# Patient Record
Sex: Female | Born: 1975
Health system: Southern US, Community
[De-identification: ages and names within clinical notes are randomized; demographics above are authoritative.]

## PROBLEM LIST (undated history)

## (undated) DIAGNOSIS — D649 Anemia, unspecified: Secondary | ICD-10-CM

## (undated) DIAGNOSIS — E785 Hyperlipidemia, unspecified: Secondary | ICD-10-CM

## (undated) DIAGNOSIS — T4145XA Adverse effect of unspecified anesthetic, initial encounter: Secondary | ICD-10-CM

## (undated) DIAGNOSIS — K219 Gastro-esophageal reflux disease without esophagitis: Secondary | ICD-10-CM

## (undated) DIAGNOSIS — T8859XA Other complications of anesthesia, initial encounter: Secondary | ICD-10-CM

## (undated) HISTORY — PX: WISDOM TOOTH EXTRACTION: SHX21

## (undated) HISTORY — PX: UPPER GI ENDOSCOPY: SHX6162

---

## 2007-12-28 ENCOUNTER — Ambulatory Visit: Payer: Self-pay | Admitting: Internal Medicine

## 2007-12-28 DIAGNOSIS — K209 Esophagitis, unspecified without bleeding: Secondary | ICD-10-CM | POA: Insufficient documentation

## 2007-12-28 DIAGNOSIS — E7800 Pure hypercholesterolemia, unspecified: Secondary | ICD-10-CM | POA: Insufficient documentation

## 2008-01-21 ENCOUNTER — Telehealth (INDEPENDENT_AMBULATORY_CARE_PROVIDER_SITE_OTHER): Payer: Self-pay | Admitting: *Deleted

## 2008-02-01 ENCOUNTER — Encounter: Payer: Self-pay | Admitting: Internal Medicine

## 2009-10-22 ENCOUNTER — Encounter: Payer: Self-pay | Admitting: Internal Medicine

## 2009-11-23 ENCOUNTER — Ambulatory Visit: Payer: Self-pay | Admitting: Internal Medicine

## 2009-11-23 DIAGNOSIS — R002 Palpitations: Secondary | ICD-10-CM | POA: Insufficient documentation

## 2009-11-23 DIAGNOSIS — I359 Nonrheumatic aortic valve disorder, unspecified: Secondary | ICD-10-CM | POA: Insufficient documentation

## 2009-11-27 ENCOUNTER — Encounter (INDEPENDENT_AMBULATORY_CARE_PROVIDER_SITE_OTHER): Payer: Self-pay | Admitting: *Deleted

## 2009-12-03 ENCOUNTER — Ambulatory Visit: Payer: Self-pay

## 2009-12-03 ENCOUNTER — Ambulatory Visit: Payer: Self-pay | Admitting: Internal Medicine

## 2009-12-03 ENCOUNTER — Ambulatory Visit (HOSPITAL_COMMUNITY): Admission: RE | Admit: 2009-12-03 | Discharge: 2009-12-03 | Payer: Self-pay | Admitting: Internal Medicine

## 2009-12-04 LAB — CONVERTED CEMR LAB
Creatinine, Ser: 0.9 mg/dL (ref 0.4–1.2)
GFR calc non Af Amer: 92.78 mL/min (ref >60)
Glucose, Bld: 89 mg/dL (ref 70–99)
Sodium: 137 meq/L (ref 135–145)

## 2010-12-10 NOTE — Letter (Signed)
Summary: Appointment - Cardiac MRI  [facility]     [place], [place]    Phone:   Fax:       November 27, 2009 MRN: [id number]   [facility] [address] [place], [place]  [address]   Dear Ms. Brouse,   We have scheduled the above patient for an appointment for a Cardiac MRI on 12-03-2009    at 3:00 p.m.  Please refer to the below information for the location and instructions for this test:  Location:     [facility]       [address]       [place], [place]  [address] Instructions:    Wilmon Arms at [facility] Outpatient Registration 45 minutes prior to your appointment time.  This will ensure you are in the Radiology Department 30 minutes prior to your appointment.    There are no restrictions for this test you may eat and take medications as usual.  If you need to reschedule this appointment please call at the number listed above.     Sincerely,     Lorne Skeens  [facility] Scheduling Team

## 2010-12-10 NOTE — Letter (Signed)
Summary: [facility] Office Note  [facility] Office Note   Imported By: Roderic Ovens 11/30/2009 14:16:21  _____________________________________________________________________  External Attachment:    Type:   Image     Comment:   External Document

## 2010-12-10 NOTE — Assessment & Plan Note (Signed)
Summary: nep/tachycardia/rapid heart beat   Referring Provider:  Rana Snare  CC:  nep/tachycardia.  Pt sometimes feels pressure in her chest after walking up a flight of stairs.  History of Present Illness: Ann Boyle is seen at the request of Dr. Rana Snare because of exercise associated palpitations.  Her last couple of months she has noted a rapid heart rate precipitated by very little exertion. She can stand up and get on a treadmill and her heartrate will be at 80 within moments and within a minute or 2 of exercise of 110 and then shortly thereafter be 170. This is unassociated with significant dyspnea.  She has noted that there is some correlation with her symptoms with volume intake. She notes that he drinks 64 ounces a day her urine is clear her and a rapid heartbeat problem is less severe.There was no antecedent viral infection. Her diet is probably salt depleted.  She also has a history of chest discomfort. This is notable with menses which for her particularly heavy. She describes this as a pressure that radiates into her back and into her right arm. It is unassociated with exertion. It is sometimes associated with recumbency and awakens her at night.  There is no family history of heart disease.  Results no family history of sudden cardiac death.  There is a family history of arachnodactyly.    Current Medications (verified): 1)  Multivitamins  Tabs (Multiple Vitamin) .... 3 Tabs Once Daily 2)  Fish Oil 1000 Mg Caps (Omega-3 Fatty Acids) .... Take One Capsule Two Times A Day 3)  Ferrous Fumarate 325 (106 Fe) Mg Tabs (Ferrous Fumarate) .... Take One Tablet Once Daily  Allergies (verified): No Known Drug Allergies  Past History:  Past Surgical History: Last updated: 12/28/2007 none  Family History: Last updated: 12/28/2007 both parents are in their mid 54s both with hypercholesterolemia two sisters, posture, hypercholesterolemia, one with diabetes  Social History: Last  updated: 12/28/2007 Married  Past Medical History: GERD dysmenorrhagia Anemia-borderline  Review of Systems       full review of systems was negative apart from a history of present illness and past medical history.   Vital Signs:  Patient profile:   Ann Boyle Height:      65 inches Weight:      118 pounds BMI:     19.71 Pulse rate:   76 / minute Pulse (ortho):   71 / minute Pulse rhythm:   regular BP sitting:   100 / 58  (left arm) BP standing:   99 / 69 Cuff size:   regular  Vitals Entered By: Judithe Modest CMA (November 23, 2009 3:52 PM)  Serial Vital Signs/Assessments:  Time      Position  BP       Pulse  Resp  Temp     By 4:55 PM   Lying RA  89/58    65                    Amanda Trulove CMA 4:55 PM   Sitting   97/63    70                    Amanda Trulove CMA 4:55 PM   Standing  99/69    71                    Amanda Trulove CMA  Comments: 4:55 PM 2 minutes-97/77 HR 75 3 minutes- 95/66 HR 66  By: Judithe Modest CMA    Physical Exam  General:  Alert and oriented young African American Boyle appearing her stated age in no acute distress. HEENT  normal . Neck veins were flat; carotids brisk and full without bruits. No lymphadenopathy. Back without kyphosis.no sternal abnormalities Lungs clear. Heart sounds regular with 2/6 diastolic murmur without gallops. PMI nondisplaced. Abdomen soft with active bowel sounds without midline pulsation or hepatomegaly. Femoral pulses and distal pulses intact. Extremities were without clubbing cyanosis or edema.  her wings and was greater than her height. She is able to touch her pinky to her thumb around her wrist, and able to manifest her thumb upon pronation on the dorsum of her hand.Skin warm and dry. Neurological exam grossly normal; affect is normal    EKG  Procedure date:  11/23/2009  Findings:      sinus rhythm at 76 Intervals 0.19/0.08/0.35 Axis LX R. prime in lead V1 Early repolarization diffusely  present  Impression & Recommendations:  Problem # 1:  PALPITATIONS (ICD-785.1) The patient has palpitations and rapid heart rates associated with exercise as documented by heart rate monitoring. It is not clear to me the mechanism of this. Specifically I don't know whether this is an autonomic phenomenon or related possibly to cardiac/aortic issues suggested by stigmata of Marfan's syndrome. These include arachnodactyly and aortic insufficiency as well as evidence of joint laxity.  The fact that she is volume responsive suggested this may be autonomic. She did not manifest orthostatic intolerance today.  Treadmill testing may be important in elucidating this although I would like to defer this until after we get an assessment of her heart and her aorta. Orders: EKG w/ Interpretation (93000) Cardiac MRI (Cardiac MRI) MRA (MRA) Echocardiogram (Echo)  Problem # 2:  MARFANS SYNDROME POSSIBLE (ICD-759.82) For the reasons mentioned above, excluding aortic disease as well as aortic valve disease will be important. I did not appreciate mitral valve prolapse on examination. We will undertake MRI and MRA looking at the cardiac structure and the aorta as well as echocardiography to look at valve function.  Problem # 3:  AORTIC INSUFFICIENCY (ICD-424.1) we'll get an echo as noted above;  I have advised her to refrain from exercise until we get the MRI scan.  Patient Instructions: 1)  Your physician has requested that you have a cardiac MRI AND MRA.  Cardiac MRI uses a computer to create images of your heart as it's beating, producing both still and moving pictures of your heart and major blood vessels. For further information please visit  [email or web address]  Please follow the instruction sheet given to you today for more information. 2)  Your physician has requested that you have an echocardiogram.  Echocardiography is a painless test that uses sound waves to create images of your heart. It provides  your doctor with information about the size and shape of your heart and how well your heart's chambers and valves are working.  This procedure takes approximately one hour. There are no restrictions for this procedure.

## 2011-02-11 ENCOUNTER — Other Ambulatory Visit: Payer: Self-pay | Admitting: Internal Medicine

## 2011-02-11 DIAGNOSIS — R609 Edema, unspecified: Secondary | ICD-10-CM

## 2011-02-13 ENCOUNTER — Ambulatory Visit
Admission: RE | Admit: 2011-02-13 | Discharge: 2011-02-13 | Disposition: A | Discharge status: Home / Self Care | Payer: BC Managed Care – PPO | Source: Ambulatory Visit | Attending: Internal Medicine | Admitting: Internal Medicine

## 2011-02-13 DIAGNOSIS — R609 Edema, unspecified: Secondary | ICD-10-CM

## 2011-09-01 DIAGNOSIS — L6681 Central centrifugal cicatricial alopecia: Secondary | ICD-10-CM | POA: Insufficient documentation

## 2011-09-01 DIAGNOSIS — L668 Other cicatricial alopecia: Secondary | ICD-10-CM | POA: Insufficient documentation

## 2011-11-12 IMAGING — US US EXTREM LOW VENOUS*L*
1 series · 14 of 24 positions shown · non-contrast
Comparison: None.

CLINICAL DATA: Left leg edema

LEFT LOWER EXTREMITY VENOUS DUPLEX ULTRASOUND
TECHNIQUE: Gray-scale sonography with graded compression, as well
as color Doppler and duplex ultrasound, were performed to evaluate
the deep venous system of the lower extremity from the level of the
common femoral vein through the popliteal and proximal calf veins.
Spectral Doppler was utilized to evaluate flow at rest and with
distal augmentation maneuvers.

[Series 1: us extrem low venous*left* · 14 of 31 slices shown]
[im 1/31]
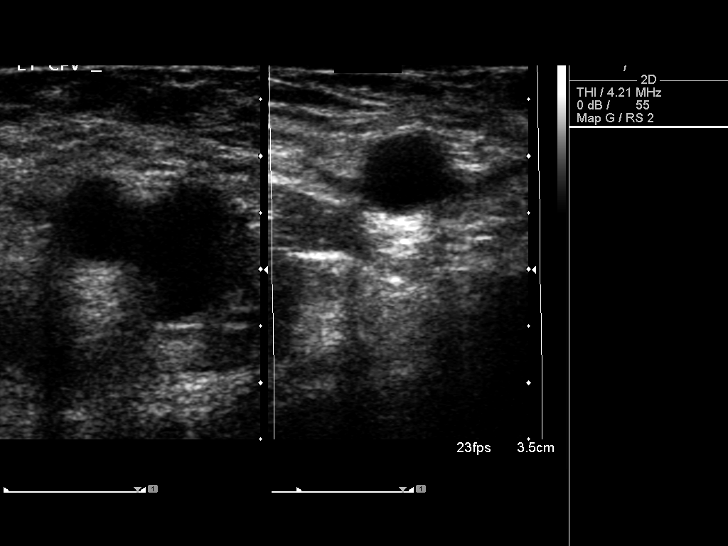
[im 3/31]
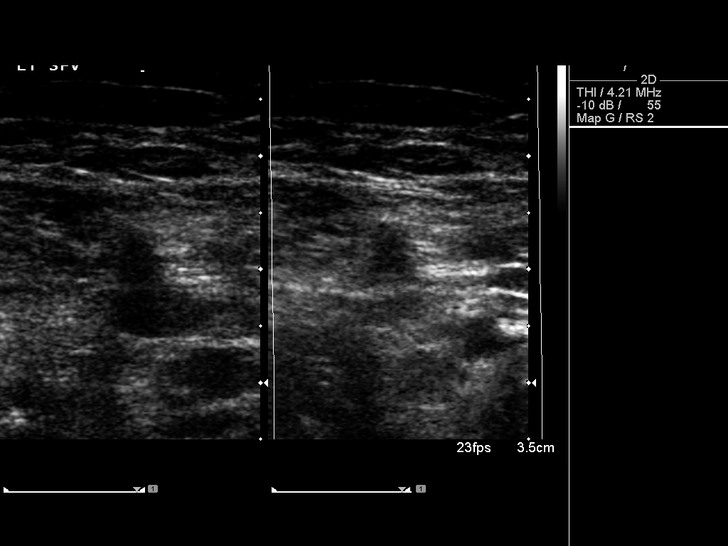
[im 6/31]
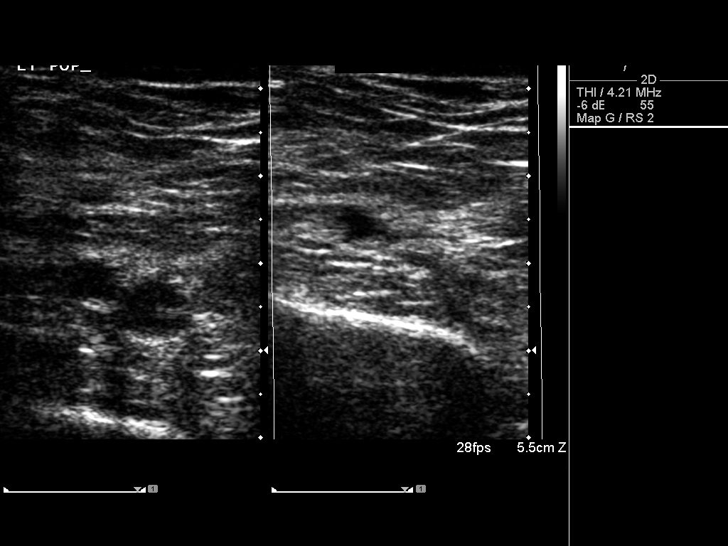
[im 8/31]
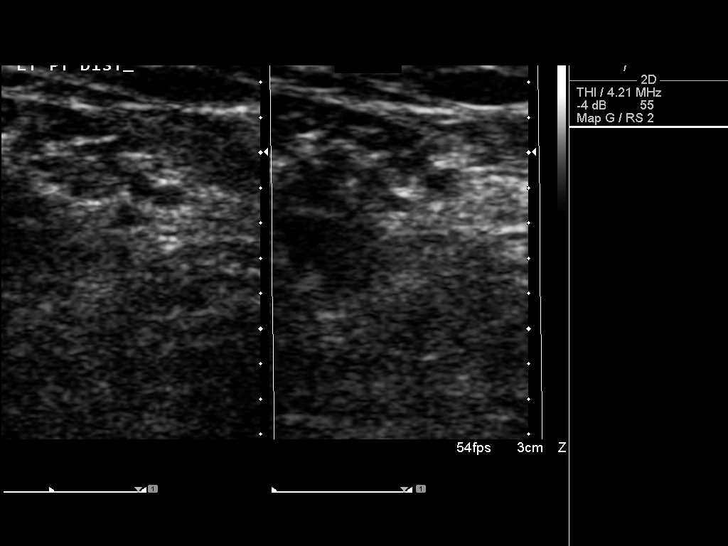
[im 10/31]
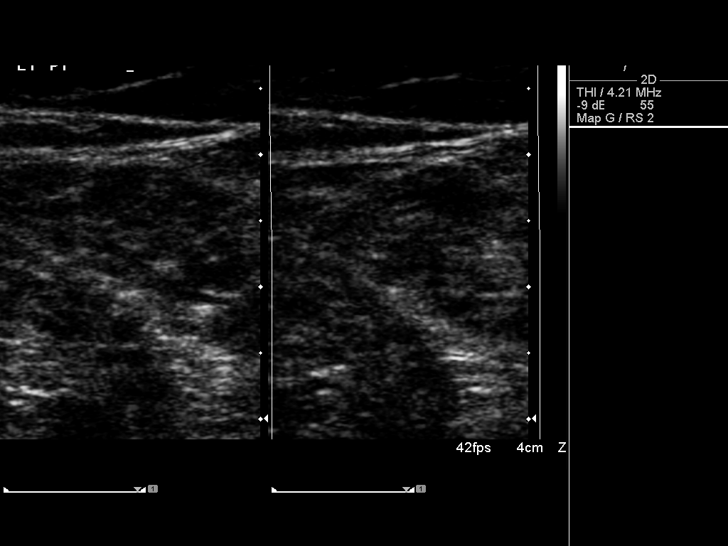
[im 12/31]
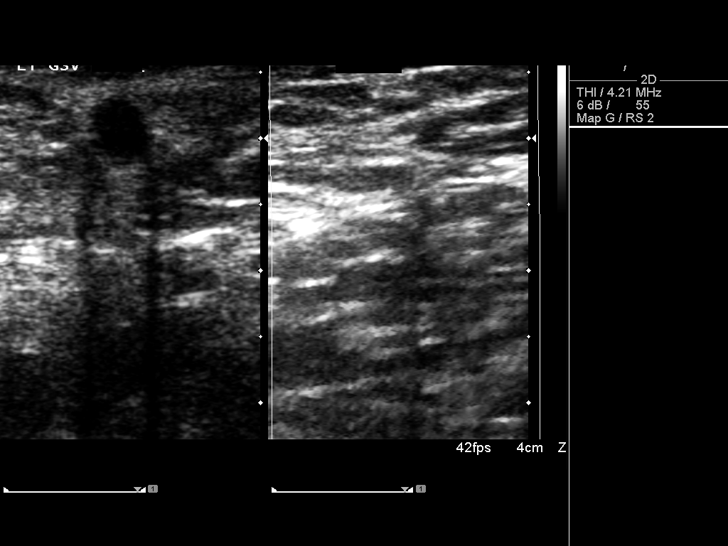
[im 15/31]
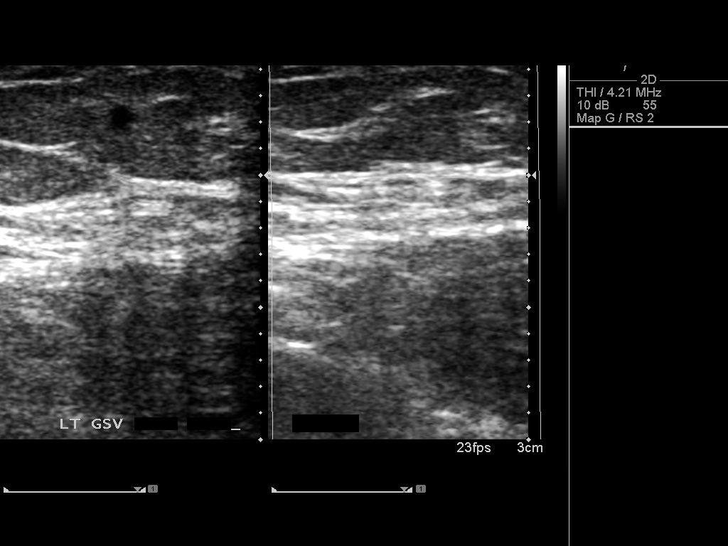
[im 16/31]
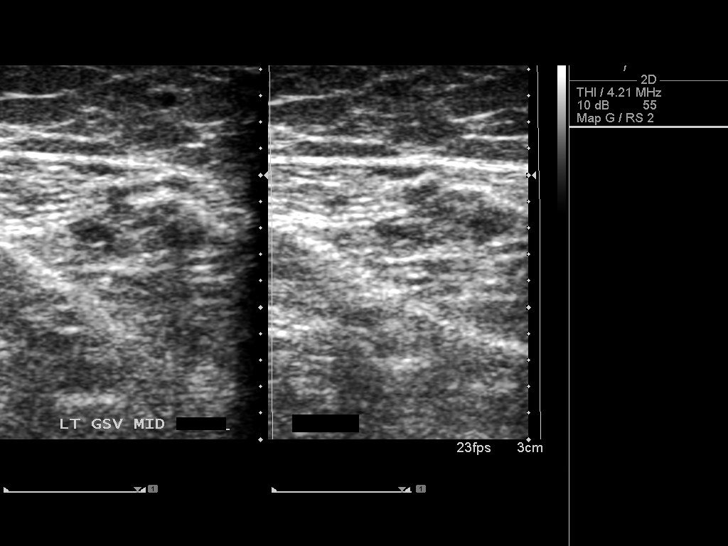
[im 19/31]
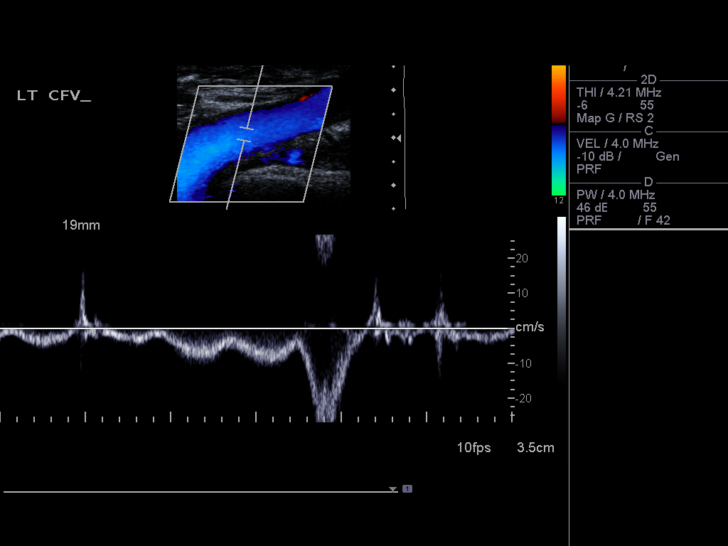
[im 21/31]
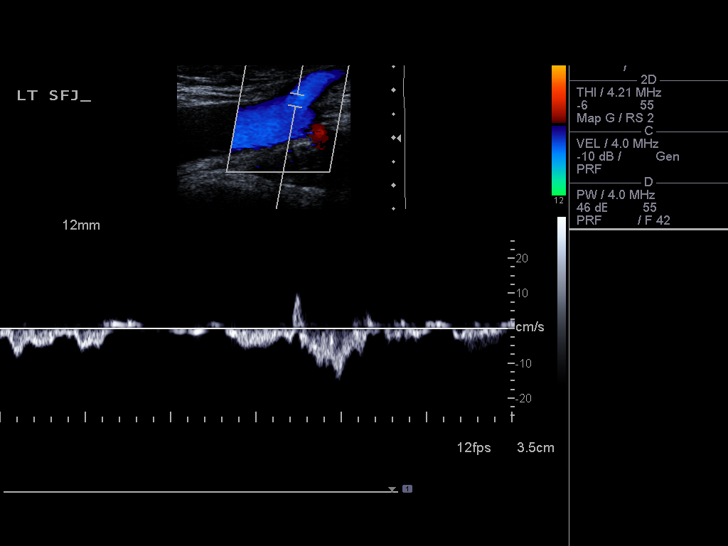
[im 24/31]
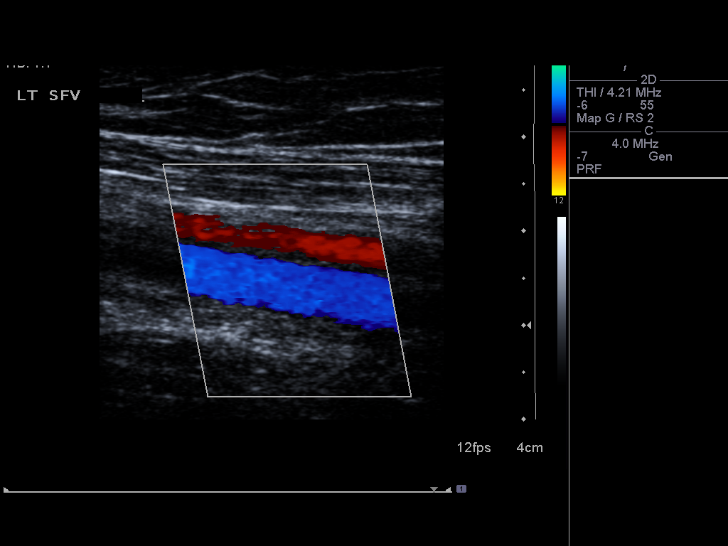
[im 25/31]
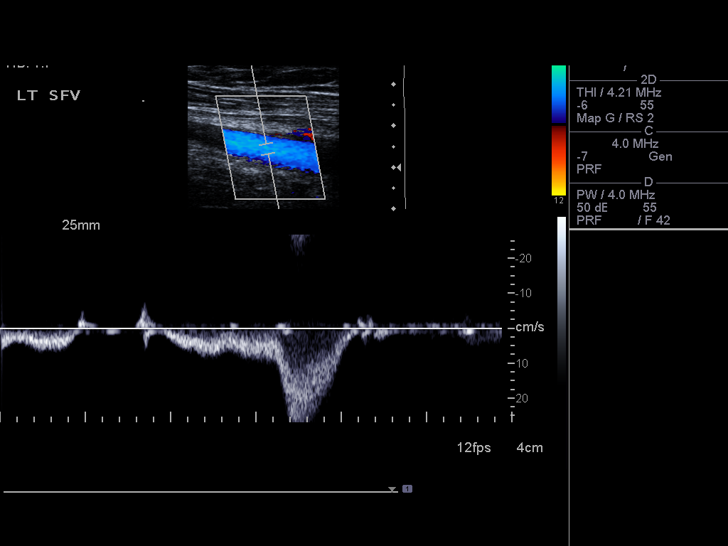
[im 28/31]
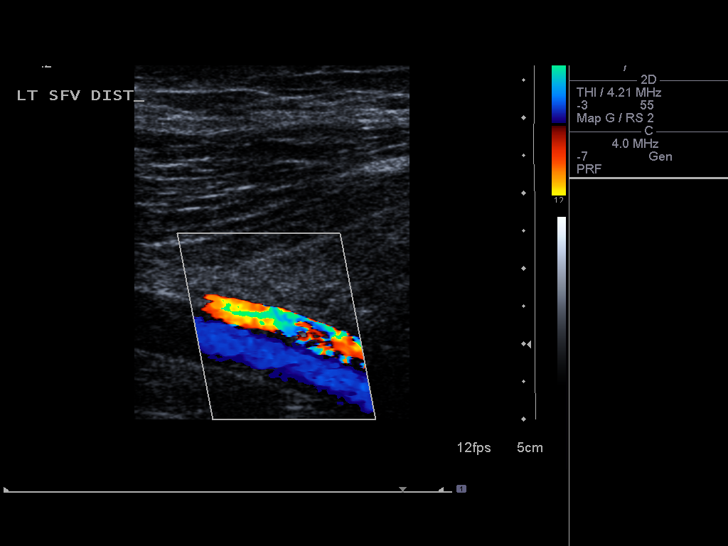
[im 31/31]
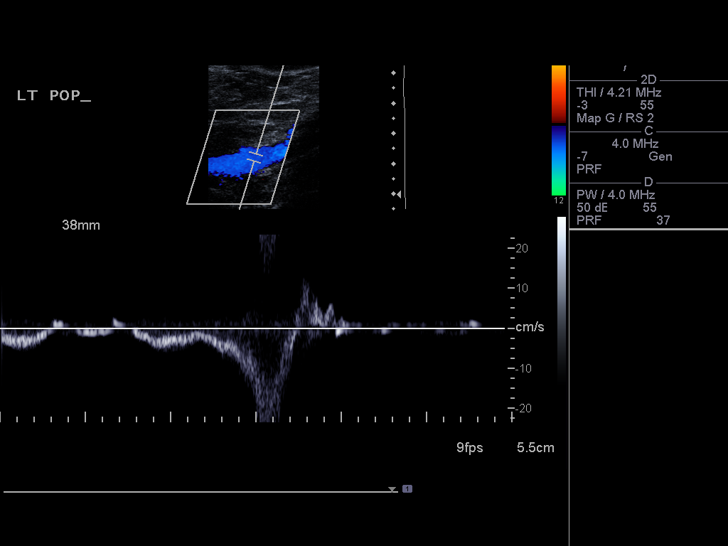

[14 of 24 positions shown; findings below may reference images not displayed]

FINDINGS: The left saphenous - femoral junction, left common
femoral vein, left profunda femoral vein, left superficial femoral
vein, and left popliteal vein all compress and augment normally.
The greater saphenous vein also compresses normally.  There is no
evidence of deep venous thrombosis.
IMPRESSION: Negative ultrasound of the left leg for deep venous thrombosis.

## 2012-02-09 ENCOUNTER — Emergency Department (HOSPITAL_COMMUNITY)
Admission: EM | Admit: 2012-02-09 | Discharge: 2012-02-10 | Disposition: A | Discharge status: Home / Self Care | Payer: BC Managed Care – PPO | Attending: Emergency Medicine | Admitting: Emergency Medicine

## 2012-02-09 ENCOUNTER — Encounter (HOSPITAL_COMMUNITY): Payer: Self-pay | Admitting: Emergency Medicine

## 2012-02-09 DIAGNOSIS — M5410 Radiculopathy, site unspecified: Secondary | ICD-10-CM

## 2012-02-09 DIAGNOSIS — M5412 Radiculopathy, cervical region: Secondary | ICD-10-CM | POA: Insufficient documentation

## 2012-02-09 NOTE — ED Notes (Signed)
Pt states she was working on the computer this evening and started having pain in her right shoulder area that radiated up into her neck  Pt states her right hand went cold  Pt denies injury

## 2012-02-09 NOTE — ED Provider Notes (Signed)
History     CSN: [id number]  Arrival date & time 02/09/12  2236   First MD Initiated Contact with Patient 02/09/12 2333      Chief Complaint  Patient presents with  . Neck Pain  . Shoulder Pain    (Consider location/radiation/quality/duration/timing/severity/associated sxs/prior treatment) HPI Comments: Patient was sitting in bed with her laptop in her lap when she noticed that she had right shoulder pain that radiated to her right arm with a feeling of coolness.  This has since resolved, since she's in the emergency room.  She has not taken any over-the-counter medication for this.  She denies trauma to her neck in the past  Patient is a 36 y.o. female presenting with neck pain and shoulder pain. The history is provided by the patient.  Neck Pain  This is a new problem. The current episode started 1 to 2 hours ago. The problem has been gradually improving. The pain is associated with an unknown factor. There has been no fever. The pain is present in the generalized neck. The pain radiates to the right shoulder and right arm. The pain is at a severity of 6/10. The pain is mild. The symptoms are aggravated by position. Associated symptoms include numbness. Pertinent negatives include no headaches and no weakness.  Shoulder Pain Associated symptoms include neck pain and numbness. Pertinent negatives include no fever, headaches or weakness.    History reviewed. No pertinent past medical history.  History reviewed. No pertinent past surgical history.  History reviewed. No pertinent family history.  History  Substance Use Topics  . Smoking status: Never Smoker   . Smokeless tobacco: Not on file  . Alcohol Use: No    OB History    Grav Para Term Preterm Abortions TAB SAB Ect Mult Living                  Review of Systems  Constitutional: Negative for fever.  HENT: Positive for neck pain. Negative for neck stiffness.   Neurological: Positive for numbness. Negative for  dizziness, weakness and headaches.    Allergies  Review of patient's allergies indicates no known allergies.  Home Medications  No current outpatient prescriptions on file.  BP 108/69  Pulse 66  Temp(Src) 98.1 F (36.7 C) (Oral)  Resp 18  SpO2 100%  LMP 02/01/2012  Physical Exam  Constitutional: She appears well-developed and well-nourished.  HENT:  Head: Normocephalic.  Neck: Normal range of motion and full passive range of motion without pain. Neck supple. Muscular tenderness present. No spinous process tenderness present. No rigidity. No edema, no erythema and normal range of motion present.    Cardiovascular: Normal rate.   Pulmonary/Chest: Effort normal.  Musculoskeletal: Normal range of motion. She exhibits no edema and no tenderness.       Full range of motion of the right shoulder, elbow, fingers, positive pulses, cap refill less than 3 second color, sensation, and extremity normal, as compared to the left  Neurological: She is alert.  Skin: Skin is warm.    ED Course  Procedures (including critical care time)  Labs Reviewed - No data to display No results found.   No diagnosis found.    MDM  Transient Radiculopathy        Arman Filter, NP 02/10/12 0007

## 2012-02-10 NOTE — Discharge Instructions (Signed)
Cervical Radiculopathy Cervical radiculopathy happens when a nerve in the neck is pinched or bruised by a slipped (herniated) disk or by arthritic changes in the bones of the cervical spine. This can occur due to an injury or as part of the normal aging process. Pressure on the cervical nerves can cause pain or numbness that runs from your neck all the way down into your arm and fingers. CAUSES  There are many possible causes, including:  Injury.   Muscle tightness in the neck from overuse.   Swollen, painful joints (arthritis).   Breakdown or degeneration in the bones and joints of the spine (spondylosis) due to aging.   Bone spurs that may develop near the cervical nerves.  SYMPTOMS  Symptoms include pain, weakness, or numbness in the affected arm and hand. Pain can be severe or irritating. Symptoms may be worse when extending or turning the neck. DIAGNOSIS  Your caregiver will ask about your symptoms and do a physical exam. He or she may test your strength and reflexes. X-rays, CT scans, and MRI scans may be needed in cases of injury or if the symptoms do not go away after a period of time. Electromyography (EMG) or nerve conduction testing may be done to study how your nerves and muscles are working. TREATMENT  Your caregiver may recommend certain exercises to help relieve your symptoms. Cervical radiculopathy can, and often does, get better with time and treatment. If your problems continue, treatment options may include:  Wearing a soft collar for short periods of time.   Physical therapy to strengthen the neck muscles.   Medicines, such as nonsteroidal anti-inflammatory drugs (NSAIDs), oral corticosteroids, or spinal injections.   Surgery. Different types of surgery may be done depending on the cause of your problems.  HOME CARE INSTRUCTIONS   Put ice on the affected area.   Put ice in a plastic bag.   Place a towel between your skin and the bag.   Leave the ice on for 15  to 20 minutes, 3 to 4 times a day or as directed by your caregiver.   Use a flat pillow when you sleep.   Only take over-the-counter or prescription medicines for pain, discomfort, or fever as directed by your caregiver.   If physical therapy was prescribed, follow your caregiver's directions.   If a soft collar was prescribed, use it as directed.  SEEK IMMEDIATE MEDICAL CARE IF:   Your pain gets much worse and cannot be controlled with medicines.   You have weakness or numbness in your hand, arm, face, or leg.   You have a high fever or a stiff, rigid neck.   You lose bowel or bladder control (incontinence).   You have trouble with walking, balance, or speaking.  MAKE SURE YOU:   Understand these instructions.   Will watch your condition.   Will get help right away if you are not doing well or get worse.  Document Released: 07/22/2001 Document Revised: 10/16/2011 Document Reviewed: 06/10/2011 [facility] Patient Information 2012 [place], [place]. I feel that this is due to the position you were sitting in while working on your laptop, if the symptoms recur or become persistent please make an appointment with your primary care physician for further evaluation of your cervical spine

## 2012-02-11 NOTE — ED Provider Notes (Signed)
Medical screening examination/treatment/procedure(s) were performed by non-physician practitioner and as supervising physician I was immediately available for consultation/collaboration.  Kauan Kloosterman, MD 02/11/12 2135 

## 2015-03-18 ENCOUNTER — Emergency Department (INDEPENDENT_AMBULATORY_CARE_PROVIDER_SITE_OTHER)
Admission: EM | Admit: 2015-03-18 | Discharge: 2015-03-18 | Disposition: A | Discharge status: Home / Self Care | Payer: BLUE CROSS/BLUE SHIELD | Source: Home / Self Care | Attending: Family Medicine | Admitting: Family Medicine

## 2015-03-18 ENCOUNTER — Encounter: Payer: Self-pay | Admitting: *Deleted

## 2015-03-18 DIAGNOSIS — H04201 Unspecified epiphora, right lacrimal gland: Secondary | ICD-10-CM

## 2015-03-18 HISTORY — DX: Hyperlipidemia, unspecified: E78.5

## 2015-03-18 MED ORDER — SULFACETAMIDE SODIUM 10 % OP SOLN: 1.0000 [drp] | OPHTHALMIC | Status: DC

## 2015-03-18 NOTE — Discharge Instructions (Signed)
Continue warm compress several times daily.  Massage right tear duct several times daily.

## 2015-03-18 NOTE — ED Provider Notes (Signed)
CSN: [id number]     Arrival date & time 03/18/15  1100 History   First MD Initiated Contact with Patient 03/18/15 1142     Chief Complaint  Patient presents with  . Eye Drainage      HPI Comments: Patient complains of two day history of increased lacrimation from her right eye.  She recalls no injury and she has no foreign body sensation.  She denies redness of her eye or change in vision, but has had mild itching.  No increase in nasal congestion.  She has had some yellowish discharge from her right eye in the morning, becoming clear during the day.  She started using some left over sulfacetamide ophthalmic suspension last night.  Patient is a 39 y.o. female presenting with eye problem. The history is provided by the patient.  Eye Problem Location:  R eye Quality: itching. Severity:  Mild Onset quality:  Sudden Duration:  2 days Timing:  Intermittent Progression:  Unchanged Chronicity:  New Context: not burn, not chemical exposure, not contact lens problem, not direct trauma, not foreign body and not scratch   Relieved by:  Nothing Worsened by:  Nothing tried Ineffective treatments:  Eye drops Associated symptoms: discharge, itching and tearing   Associated symptoms: no blurred vision, no crusting, no decreased vision, no double vision, no foreign body sensation, no headaches, no inflammation, no photophobia, no redness, no scotomas and no swelling   Risk factors: no recent URI     Past Medical History  Diagnosis Date  . Hyperlipidemia    History reviewed. No pertinent past surgical history. History reviewed. No pertinent family history. History  Substance Use Topics  . Smoking status: Never Smoker   . Smokeless tobacco: Never Used  . Alcohol Use: No   OB History    No data available     Review of Systems  Eyes: Positive for discharge and itching. Negative for blurred vision, double vision, photophobia and redness.  Neurological: Negative for headaches.  All other  systems reviewed and are negative.   Allergies  Review of patient's allergies indicates no known allergies.  Home Medications   Prior to Admission medications   Medication Sig Start Date End Date Taking? Authorizing Provider  sulfacetamide (BLEPH-10) 10 % ophthalmic solution Place 1-2 drops into the right eye every 4 (four) hours. 03/18/15   Kandra Nicolas, MD   BP 98/61 mmHg  Pulse 79  Temp(Src) 97.9 F (36.6 C) (Oral)  Resp 14  Wt 115 lb (52.164 kg)  SpO2 100%  LMP 02/27/2015 Physical Exam  Constitutional: She appears well-developed and well-nourished. No distress.  HENT:  Head: Normocephalic.  Nose: Nose normal.  Mouth/Throat: Oropharynx is clear and moist.  Eyes: Conjunctivae, EOM and lids are normal. Pupils are equal, round, and reactive to light. Lids are everted and swept, no foreign bodies found. Right eye exhibits no chemosis, no discharge, no exudate and no hordeolum. No foreign body present in the right eye. Left eye exhibits no chemosis and no discharge.  Fluorescein to the right eye reveals no uptake.  Neck: Neck supple.  Lymphadenopathy:    She has no cervical adenopathy.  Nursing note and vitals reviewed.   ED Course  Procedures  none  MDM  Excessive lacrimation, right ?etiology.  Note normal exam   Continue sulfacetamide ophthalmic suspension.  Continue warm compress several times daily.  Massage right tear duct several times daily. Followup with ophthalmologist if not improved one week.     Kandra Nicolas,  MD 03/27/15 1213

## 2015-03-18 NOTE — ED Notes (Signed)
Pt c/o 2-3 days of right eye drainage and itching.

## 2016-07-02 DIAGNOSIS — B373 Candidiasis of vulva and vagina: Secondary | ICD-10-CM | POA: Diagnosis not present

## 2016-07-02 DIAGNOSIS — Z01419 Encounter for gynecological examination (general) (routine) without abnormal findings: Secondary | ICD-10-CM | POA: Diagnosis not present

## 2016-07-02 DIAGNOSIS — Z1151 Encounter for screening for human papillomavirus (HPV): Secondary | ICD-10-CM | POA: Diagnosis not present

## 2016-07-02 DIAGNOSIS — Z681 Body mass index (BMI) 19 or less, adult: Secondary | ICD-10-CM | POA: Diagnosis not present

## 2016-07-08 ENCOUNTER — Other Ambulatory Visit (HOSPITAL_COMMUNITY): Payer: Self-pay | Admitting: Obstetrics and Gynecology

## 2016-07-08 DIAGNOSIS — N920 Excessive and frequent menstruation with regular cycle: Secondary | ICD-10-CM

## 2016-07-10 ENCOUNTER — Ambulatory Visit (HOSPITAL_COMMUNITY): Payer: BLUE CROSS/BLUE SHIELD

## 2016-07-15 DIAGNOSIS — S0591XA Unspecified injury of right eye and orbit, initial encounter: Secondary | ICD-10-CM | POA: Diagnosis not present

## 2016-09-16 DIAGNOSIS — Z23 Encounter for immunization: Secondary | ICD-10-CM | POA: Diagnosis not present

## 2017-03-27 ENCOUNTER — Other Ambulatory Visit: Payer: Self-pay | Admitting: Internal Medicine

## 2017-03-27 DIAGNOSIS — Z0000 Encounter for general adult medical examination without abnormal findings: Secondary | ICD-10-CM | POA: Diagnosis not present

## 2017-03-27 DIAGNOSIS — Z1231 Encounter for screening mammogram for malignant neoplasm of breast: Secondary | ICD-10-CM

## 2017-03-27 DIAGNOSIS — D649 Anemia, unspecified: Secondary | ICD-10-CM | POA: Diagnosis not present

## 2017-03-27 DIAGNOSIS — Z23 Encounter for immunization: Secondary | ICD-10-CM | POA: Diagnosis not present

## 2017-04-03 ENCOUNTER — Ambulatory Visit
Admission: RE | Admit: 2017-04-03 | Discharge: 2017-04-03 | Disposition: A | Discharge status: Home / Self Care | Payer: BLUE CROSS/BLUE SHIELD | Source: Ambulatory Visit | Attending: Internal Medicine | Admitting: Internal Medicine

## 2017-04-03 DIAGNOSIS — Z1231 Encounter for screening mammogram for malignant neoplasm of breast: Secondary | ICD-10-CM | POA: Diagnosis not present

## 2017-05-19 DIAGNOSIS — N920 Excessive and frequent menstruation with regular cycle: Secondary | ICD-10-CM | POA: Diagnosis not present

## 2017-05-22 DIAGNOSIS — R05 Cough: Secondary | ICD-10-CM | POA: Diagnosis not present

## 2017-06-29 ENCOUNTER — Encounter (HOSPITAL_COMMUNITY): Payer: Self-pay | Admitting: *Deleted

## 2017-07-01 ENCOUNTER — Other Ambulatory Visit: Payer: Self-pay | Admitting: Obstetrics and Gynecology

## 2017-07-16 ENCOUNTER — Ambulatory Visit (HOSPITAL_COMMUNITY)
Admission: RE | Admit: 2017-07-16 | Payer: BLUE CROSS/BLUE SHIELD | Source: Ambulatory Visit | Admitting: Obstetrics and Gynecology

## 2017-07-16 HISTORY — DX: Gastro-esophageal reflux disease without esophagitis: K21.9

## 2017-07-16 SURGERY — DILATATION & CURETTAGE/HYSTEROSCOPY WITH MYOSURE
Anesthesia: Choice

## 2017-08-05 DIAGNOSIS — Z3040 Encounter for surveillance of contraceptives, unspecified: Secondary | ICD-10-CM | POA: Diagnosis not present

## 2017-08-05 DIAGNOSIS — Z01419 Encounter for gynecological examination (general) (routine) without abnormal findings: Secondary | ICD-10-CM | POA: Diagnosis not present

## 2017-08-05 DIAGNOSIS — N920 Excessive and frequent menstruation with regular cycle: Secondary | ICD-10-CM | POA: Diagnosis not present

## 2017-08-05 DIAGNOSIS — K621 Rectal polyp: Secondary | ICD-10-CM | POA: Diagnosis not present

## 2017-08-05 DIAGNOSIS — K30 Functional dyspepsia: Secondary | ICD-10-CM | POA: Diagnosis not present

## 2017-08-05 DIAGNOSIS — Z1231 Encounter for screening mammogram for malignant neoplasm of breast: Secondary | ICD-10-CM | POA: Diagnosis not present

## 2017-08-05 DIAGNOSIS — Z6820 Body mass index (BMI) 20.0-20.9, adult: Secondary | ICD-10-CM | POA: Diagnosis not present

## 2017-08-18 DIAGNOSIS — R1013 Epigastric pain: Secondary | ICD-10-CM | POA: Diagnosis not present

## 2017-08-18 DIAGNOSIS — K219 Gastro-esophageal reflux disease without esophagitis: Secondary | ICD-10-CM | POA: Diagnosis not present

## 2017-08-18 DIAGNOSIS — D509 Iron deficiency anemia, unspecified: Secondary | ICD-10-CM | POA: Diagnosis not present

## 2017-11-18 DIAGNOSIS — Z23 Encounter for immunization: Secondary | ICD-10-CM | POA: Diagnosis not present

## 2018-05-03 DIAGNOSIS — D509 Iron deficiency anemia, unspecified: Secondary | ICD-10-CM | POA: Diagnosis not present

## 2018-05-03 DIAGNOSIS — K259 Gastric ulcer, unspecified as acute or chronic, without hemorrhage or perforation: Secondary | ICD-10-CM | POA: Diagnosis not present

## 2018-05-03 DIAGNOSIS — K319 Disease of stomach and duodenum, unspecified: Secondary | ICD-10-CM | POA: Diagnosis not present

## 2018-05-03 DIAGNOSIS — K219 Gastro-esophageal reflux disease without esophagitis: Secondary | ICD-10-CM | POA: Diagnosis not present

## 2018-05-03 DIAGNOSIS — K2970 Gastritis, unspecified, without bleeding: Secondary | ICD-10-CM | POA: Diagnosis not present

## 2018-05-03 DIAGNOSIS — R1013 Epigastric pain: Secondary | ICD-10-CM | POA: Diagnosis not present

## 2018-07-22 DIAGNOSIS — N920 Excessive and frequent menstruation with regular cycle: Secondary | ICD-10-CM | POA: Diagnosis not present

## 2018-08-02 ENCOUNTER — Other Ambulatory Visit: Payer: Self-pay | Admitting: Obstetrics and Gynecology

## 2018-08-03 ENCOUNTER — Encounter (HOSPITAL_COMMUNITY): Payer: Self-pay | Admitting: *Deleted

## 2018-08-03 ENCOUNTER — Other Ambulatory Visit: Payer: Self-pay

## 2018-08-05 DIAGNOSIS — R1013 Epigastric pain: Secondary | ICD-10-CM | POA: Diagnosis not present

## 2018-08-05 DIAGNOSIS — K219 Gastro-esophageal reflux disease without esophagitis: Secondary | ICD-10-CM | POA: Diagnosis not present

## 2018-08-05 DIAGNOSIS — D509 Iron deficiency anemia, unspecified: Secondary | ICD-10-CM | POA: Diagnosis not present

## 2018-08-11 DIAGNOSIS — Z01419 Encounter for gynecological examination (general) (routine) without abnormal findings: Secondary | ICD-10-CM | POA: Diagnosis not present

## 2018-08-11 DIAGNOSIS — Z1231 Encounter for screening mammogram for malignant neoplasm of breast: Secondary | ICD-10-CM | POA: Diagnosis not present

## 2018-08-11 DIAGNOSIS — Z124 Encounter for screening for malignant neoplasm of cervix: Secondary | ICD-10-CM | POA: Diagnosis not present

## 2018-08-11 DIAGNOSIS — Z6820 Body mass index (BMI) 20.0-20.9, adult: Secondary | ICD-10-CM | POA: Diagnosis not present

## 2018-08-11 NOTE — H&P (Signed)
   Reason for admission:   Hysteroscopy with D&C and possible polypectomy/myomectomy History:     42 yo G0 scheduled for HSC/D&C/polypectomy/myomectomy for menorrhagia with anemia Medical records from [facility] with TVUS 05/2017: 2.3 cm polyp vs submucosal fibroid. Patient opted to proceed with [place] and not repeat SIS  Cycle monthly, 6-7 days, 1.5 heavy day requiring 1 super plus tampon per hour, clots ad 1 cm, dysmenorrhea starts 2 days before and lasts 5 more days, low abdomen with low back pain, intensity 4-5/10 Naproxen 500 =0/10. No BTB, no dyspareunia, no PCB. Not planning pregnancy but pregnancy would be welcomed  Review of system:  Non-contributory   Past Medical History:   Past Medical History:  Diagnosis Date  . Anemia   . Complication of anesthesia    reaction to laughing gas  . GERD (gastroesophageal reflux disease)    diet controlled - otc pepcid prn  . Hyperlipidemia    diet controlled    Allergies:  No Known Allergies  Social History:  Married, non-smoker, homemaker  Family History:  Mother with anemia Sister with S trait  Physical exam:    General Appearance: Alert, appropriate appearance for age. No acute distress Neck / Thyroid: Supple, no masses, nodes or enlargement Lungs: clear to auscultation bilaterally Back: No CVA tenderness. Cardiovascular: Regular rate and rhythm. S1, S2, no murmur Gastrointestinal: Soft, non-tender, no masses or organomegaly Pelvic Exam: Vulva and vagina appear normal. Bimanual exam reveals enlarged  Uterus at 6-8 weeks. Normal adnexa x 2   Assessment:   Menorrhagia with possible submucosal fibroid   Plan:    Proceed with Hysteroscopy / polypectomy/ D&C+/- myomectomy. Procedure, risks and benefits reviewed with patient including but not limited to bleeding, infection, injury to uterus and subsequently to intra-abdominal organs and expected recovery. Patient voices understanding and is agreeable to proceed.       Delsa Bern A MD

## 2018-08-24 ENCOUNTER — Other Ambulatory Visit: Payer: Self-pay

## 2018-08-24 ENCOUNTER — Ambulatory Visit (HOSPITAL_COMMUNITY): Payer: BLUE CROSS/BLUE SHIELD | Admitting: Certified Registered Nurse Anesthetist

## 2018-08-24 ENCOUNTER — Encounter (HOSPITAL_COMMUNITY)
Admission: AD | Disposition: A | Discharge status: Home / Self Care | Payer: Self-pay | Source: Ambulatory Visit | Attending: Obstetrics and Gynecology

## 2018-08-24 ENCOUNTER — Ambulatory Visit (HOSPITAL_COMMUNITY)
Admission: AD | Admit: 2018-08-24 | Discharge: 2018-08-24 | Disposition: A | Discharge status: Home / Self Care | Payer: BLUE CROSS/BLUE SHIELD | Source: Ambulatory Visit | Attending: Obstetrics and Gynecology | Admitting: Obstetrics and Gynecology

## 2018-08-24 ENCOUNTER — Encounter (HOSPITAL_COMMUNITY): Payer: Self-pay

## 2018-08-24 DIAGNOSIS — Z79899 Other long term (current) drug therapy: Secondary | ICD-10-CM | POA: Diagnosis not present

## 2018-08-24 DIAGNOSIS — K219 Gastro-esophageal reflux disease without esophagitis: Secondary | ICD-10-CM | POA: Insufficient documentation

## 2018-08-24 DIAGNOSIS — D649 Anemia, unspecified: Secondary | ICD-10-CM | POA: Diagnosis not present

## 2018-08-24 DIAGNOSIS — E785 Hyperlipidemia, unspecified: Secondary | ICD-10-CM | POA: Diagnosis not present

## 2018-08-24 DIAGNOSIS — D259 Leiomyoma of uterus, unspecified: Secondary | ICD-10-CM | POA: Diagnosis not present

## 2018-08-24 DIAGNOSIS — N920 Excessive and frequent menstruation with regular cycle: Secondary | ICD-10-CM | POA: Diagnosis not present

## 2018-08-24 DIAGNOSIS — N840 Polyp of corpus uteri: Secondary | ICD-10-CM | POA: Insufficient documentation

## 2018-08-24 HISTORY — PX: DILATATION & CURRETTAGE/HYSTEROSCOPY WITH RESECTOCOPE: SHX5572

## 2018-08-24 HISTORY — DX: Other complications of anesthesia, initial encounter: T88.59XA

## 2018-08-24 HISTORY — DX: Adverse effect of unspecified anesthetic, initial encounter: T41.45XA

## 2018-08-24 HISTORY — DX: Anemia, unspecified: D64.9

## 2018-08-24 LAB — CBC
HCT: 33.8 % — ABNORMAL LOW (ref 36.0–46.0)
HEMOGLOBIN: 11.1 g/dL — AB (ref 12.0–15.0)
MCH: 29.6 pg (ref 26.0–34.0)
MCHC: 32.8 g/dL (ref 30.0–36.0)
MCV: 90.1 fL (ref 80.0–100.0)
NRBC: 0 % (ref 0.0–0.2)
PLATELETS: 194 10*3/uL (ref 150–400)
RBC: 3.75 MIL/uL — AB (ref 3.87–5.11)
RDW: 15.4 % (ref 11.5–15.5)
WBC: 3.7 10*3/uL — ABNORMAL LOW (ref 4.0–10.5)

## 2018-08-24 LAB — PREGNANCY, URINE: PREG TEST UR: NEGATIVE

## 2018-08-24 SURGERY — DILATATION & CURETTAGE/HYSTEROSCOPY WITH RESECTOCOPE
Anesthesia: General | Site: Vagina

## 2018-08-24 MED ORDER — PROPOFOL 10 MG/ML IV BOLUS
INTRAVENOUS | Status: AC
  Filled 2018-08-24: qty 20

## 2018-08-24 MED ORDER — MIDAZOLAM HCL 2 MG/2ML IJ SOLN
INTRAMUSCULAR | Status: DC | PRN
  Administered 2018-08-24: 2 mg via INTRAVENOUS

## 2018-08-24 MED ORDER — PROPOFOL 10 MG/ML IV BOLUS
INTRAVENOUS | Status: DC | PRN
  Administered 2018-08-24: 200 mg via INTRAVENOUS

## 2018-08-24 MED ORDER — LIDOCAINE HCL (CARDIAC) PF 100 MG/5ML IV SOSY
PREFILLED_SYRINGE | INTRAVENOUS | Status: DC | PRN
  Administered 2018-08-24: 100 mg via INTRAVENOUS

## 2018-08-24 MED ORDER — GLYCOPYRROLATE 0.2 MG/ML IJ SOLN
INTRAMUSCULAR | Status: AC
  Filled 2018-08-24: qty 1

## 2018-08-24 MED ORDER — SCOPOLAMINE 1 MG/3DAYS TD PT72
MEDICATED_PATCH | TRANSDERMAL | Status: AC
  Administered 2018-08-24: 1.5 mg via TRANSDERMAL
  Filled 2018-08-24: qty 1

## 2018-08-24 MED ORDER — GLYCOPYRROLATE 0.2 MG/ML IJ SOLN
INTRAMUSCULAR | Status: DC | PRN
  Administered 2018-08-24: 0.2 mg via INTRAVENOUS

## 2018-08-24 MED ORDER — METOCLOPRAMIDE HCL 5 MG/ML IJ SOLN: 10.0000 mg | Freq: Once | INTRAMUSCULAR | Status: DC | PRN

## 2018-08-24 MED ORDER — MIDAZOLAM HCL 2 MG/2ML IJ SOLN
INTRAMUSCULAR | Status: AC
  Filled 2018-08-24: qty 2

## 2018-08-24 MED ORDER — FENTANYL CITRATE (PF) 100 MCG/2ML IJ SOLN
INTRAMUSCULAR | Status: DC | PRN
  Administered 2018-08-24 (×2): 25 ug via INTRAVENOUS
  Administered 2018-08-24: 50 ug via INTRAVENOUS

## 2018-08-24 MED ORDER — SODIUM CHLORIDE 0.9 % IR SOLN
Status: DC | PRN
  Administered 2018-08-24 (×2): 3000 mL

## 2018-08-24 MED ORDER — CHLOROPROCAINE HCL 1 % IJ SOLN
INTRAMUSCULAR | Status: AC
  Filled 2018-08-24: qty 30

## 2018-08-24 MED ORDER — LACTATED RINGERS IV SOLN
INTRAVENOUS | Status: DC
  Administered 2018-08-24 (×2): via INTRAVENOUS

## 2018-08-24 MED ORDER — LIDOCAINE HCL (CARDIAC) PF 100 MG/5ML IV SOSY
PREFILLED_SYRINGE | INTRAVENOUS | Status: AC
  Filled 2018-08-24: qty 5

## 2018-08-24 MED ORDER — CEFAZOLIN SODIUM-DEXTROSE 2-4 GM/100ML-% IV SOLN
2.0000 g | INTRAVENOUS | Status: AC
  Administered 2018-08-24: 2 g via INTRAVENOUS

## 2018-08-24 MED ORDER — SCOPOLAMINE 1 MG/3DAYS TD PT72
1.0000 | MEDICATED_PATCH | Freq: Once | TRANSDERMAL | Status: DC
  Administered 2018-08-24: 1.5 mg via TRANSDERMAL

## 2018-08-24 MED ORDER — KETOROLAC TROMETHAMINE 30 MG/ML IJ SOLN
INTRAMUSCULAR | Status: DC | PRN
  Administered 2018-08-24: 30 mg via INTRAVENOUS

## 2018-08-24 MED ORDER — MEPERIDINE HCL 25 MG/ML IJ SOLN: 6.2500 mg | INTRAMUSCULAR | Status: DC | PRN

## 2018-08-24 MED ORDER — CEFAZOLIN SODIUM-DEXTROSE 2-4 GM/100ML-% IV SOLN
INTRAVENOUS | Status: AC
  Filled 2018-08-24: qty 100

## 2018-08-24 MED ORDER — DEXAMETHASONE SODIUM PHOSPHATE 10 MG/ML IJ SOLN
INTRAMUSCULAR | Status: DC | PRN
  Administered 2018-08-24: 4 mg via INTRAVENOUS

## 2018-08-24 MED ORDER — CHLOROPROCAINE HCL 1 % IJ SOLN
INTRAMUSCULAR | Status: DC | PRN
  Administered 2018-08-24: 10 mL

## 2018-08-24 MED ORDER — LACTATED RINGERS IV SOLN: INTRAVENOUS | Status: DC

## 2018-08-24 MED ORDER — FENTANYL CITRATE (PF) 100 MCG/2ML IJ SOLN
INTRAMUSCULAR | Status: AC
  Filled 2018-08-24: qty 2

## 2018-08-24 MED ORDER — ONDANSETRON HCL 4 MG/2ML IJ SOLN
INTRAMUSCULAR | Status: AC
  Filled 2018-08-24: qty 2

## 2018-08-24 MED ORDER — ONDANSETRON HCL 4 MG/2ML IJ SOLN
INTRAMUSCULAR | Status: DC | PRN
  Administered 2018-08-24: 4 mg via INTRAVENOUS

## 2018-08-24 MED ORDER — KETOROLAC TROMETHAMINE 30 MG/ML IJ SOLN
INTRAMUSCULAR | Status: AC
  Filled 2018-08-24: qty 1

## 2018-08-24 MED ORDER — FENTANYL CITRATE (PF) 100 MCG/2ML IJ SOLN
25.0000 ug | INTRAMUSCULAR | Status: DC | PRN
  Administered 2018-08-24: 25 ug via INTRAVENOUS

## 2018-08-24 MED ORDER — DEXAMETHASONE SODIUM PHOSPHATE 4 MG/ML IJ SOLN
INTRAMUSCULAR | Status: AC
  Filled 2018-08-24: qty 1

## 2018-08-24 SURGICAL SUPPLY — 16 items
BIPOLAR CUTTING LOOP 21FR (ELECTRODE) ×1
BOOTIES KNEE HIGH SLOAN (MISCELLANEOUS) ×4 IMPLANT
CANISTER SUCT 3000ML PPV (MISCELLANEOUS) ×2 IMPLANT
CATH ROBINSON RED A/P 16FR (CATHETERS) ×2 IMPLANT
DILATOR CANAL MILEX (MISCELLANEOUS) ×2 IMPLANT
ELECT REM PT RETURN 9FT ADLT (ELECTROSURGICAL) ×2
ELECTRODE REM PT RTRN 9FT ADLT (ELECTROSURGICAL) ×1 IMPLANT
GLOVE BIOGEL PI IND STRL 7.0 (GLOVE) ×2 IMPLANT
GLOVE BIOGEL PI INDICATOR 7.0 (GLOVE) ×2
GOWN STRL REUS W/TWL LRG LVL3 (GOWN DISPOSABLE) ×4 IMPLANT
LOOP CUTTING BIPOLAR 21FR (ELECTRODE) ×1 IMPLANT
PACK VAGINAL MINOR WOMEN LF (CUSTOM PROCEDURE TRAY) ×2 IMPLANT
PAD OB MATERNITY 4.3X12.25 (PERSONAL CARE ITEMS) ×2 IMPLANT
TOWEL OR 17X24 6PK STRL BLUE (TOWEL DISPOSABLE) ×2 IMPLANT
TUBING AQUILEX INFLOW (TUBING) ×2 IMPLANT
TUBING AQUILEX OUTFLOW (TUBING) ×2 IMPLANT

## 2018-08-24 NOTE — Anesthesia Preprocedure Evaluation (Signed)
Anesthesia Evaluation  Patient identified by MRN, date of birth, ID band Patient awake    Reviewed: Allergy & Precautions, NPO status , Patient's Chart, lab work & pertinent test results  Airway Mallampati: II  TM Distance: >3 FB Neck ROM: Full    Dental no notable dental hx.    Pulmonary neg pulmonary ROS,    Pulmonary exam normal breath sounds clear to auscultation       Cardiovascular negative cardio ROS Normal cardiovascular exam Rhythm:Regular Rate:Normal     Neuro/Psych negative neurological ROS  negative psych ROS   GI/Hepatic negative GI ROS, Neg liver ROS,   Endo/Other  negative endocrine ROS  Renal/GU negative Renal ROS  negative genitourinary   Musculoskeletal negative musculoskeletal ROS (+)   Abdominal   Peds negative pediatric ROS (+)  Hematology negative hematology ROS (+)   Anesthesia Other Findings   Reproductive/Obstetrics negative OB ROS                             Anesthesia Physical Anesthesia Plan  ASA: I  Anesthesia Plan: General   Post-op Pain Management:    Induction: Intravenous  PONV Risk Score and Plan: 4 or greater and Ondansetron, Dexamethasone, Midazolam and Scopolamine patch - Pre-op  Airway Management Planned: LMA  Additional Equipment:   Intra-op Plan:   Post-operative Plan: Extubation in OR  Informed Consent: I have reviewed the patients History and Physical, chart, labs and discussed the procedure including the risks, benefits and alternatives for the proposed anesthesia with the patient or authorized representative who has indicated his/her understanding and acceptance.   Dental advisory given  Plan Discussed with: CRNA  Anesthesia Plan Comments:         Anesthesia Quick Evaluation

## 2018-08-24 NOTE — Transfer of Care (Signed)
Immediate Anesthesia Transfer of Care Note  Patient: Ann Boyle  Procedure(s) Performed: DILATATION & CURETTAGE/HYSTEROSCOPY WITH RESECTOCOPE (N/A Vagina )  Patient Location: PACU  Anesthesia Type:General  Level of Consciousness: awake, alert  and oriented  Airway & Oxygen Therapy: Patient Spontanous Breathing and Patient connected to nasal cannula oxygen  Post-op Assessment: Report given to RN, Post -op Vital signs reviewed and stable and Patient moving all extremities X 4  Post vital signs: Reviewed and stable  Last Vitals:  Vitals Value Taken Time  BP 117/69 08/24/2018  2:30 PM  Temp    Pulse 99 08/24/2018  2:30 PM  Resp 21 08/24/2018  2:30 PM  SpO2 100 % 08/24/2018  2:30 PM  Vitals shown include unvalidated device data.  Last Pain:  Vitals:   08/24/18 1155  TempSrc: Oral  PainSc: 0-No pain      Patients Stated Pain Goal: 3 (84/72/07 2182)  Complications: No apparent anesthesia complications

## 2018-08-24 NOTE — Interval H&P Note (Signed)
History and Physical Interval Note:  08/24/2018 12:17 PM  Ann Boyle  has presented today for surgery, with the diagnosis of Menorhhagia and Fibroid  The various methods of treatment have been discussed with the patient and family. After consideration of risks, benefits and other options for treatment, the patient has consented to  Procedure(s): [place] (N/A) as a surgical intervention .  The patient's history has been reviewed, patient examined, no change in status, stable for surgery.  I have reviewed the patient's chart and labs.  Questions were answered to the patient's satisfaction.     [name] A Mateya Torti

## 2018-08-24 NOTE — Anesthesia Procedure Notes (Signed)
Procedure Name: LMA Insertion Date/Time: 08/24/2018 12:53 PM Performed by: Hewitt Blade, CRNA Pre-anesthesia Checklist: Patient identified, Emergency Drugs available, Suction available and Patient being monitored Patient Re-evaluated:Patient Re-evaluated prior to induction Oxygen Delivery Method: Circle system utilized Preoxygenation: Pre-oxygenation with 100% oxygen Induction Type: IV induction Ventilation: Mask ventilation without difficulty LMA Size: 3.0 Number of attempts: 1 Placement Confirmation: positive ETCO2 and breath sounds checked- equal and bilateral Tube secured with: Tape Dental Injury: Teeth and Oropharynx as per pre-operative assessment

## 2018-08-24 NOTE — Op Note (Signed)
Preop diagnosis: menorrhagia with endometrial mass  Postop diagnosis: same with endometrial polyp  Anesthesia: IV sedation and paracervical block  Anesthesiologist: Dr. Marcell Barlow  Procedure: Hysteroscopy, resection of endometrial polyp and curettage  Surgeon: Dr. [name] Rosealee Recinos  Procedure: After being informed of the planned procedure with possible complications including bleeding, infection and uterine perforation, informed consent was obtained and patient was taken to OR #4.  She was given IV sedation without complication. She was placed in a dorsal decubitus position, prepped and her bladder was emptied with a IN/OUT red rubber catheter. Pelvic exam reveals anteverted uterus, enlarged to 8 weeks with 2 normal adnexas.  A weighted speculum is inserted in the vagina. The cervix was grasped with a tenaculum forcep placed on the anterior lip.We proceed with a paracervical block using 1% Nesacaine, 10 cc. Uterus is sounded at 9. The cervix is then easily dilated using Hegar dilator until # 31. This allows for easy placement of a diagnostic hysteroscope. With perfusion of Normal Saline at a maximum pressure of 90 mmHg, we are able to evaluate the entire uterine cavity.  Observation: Endometrium is generous with a anterior endometrial polyp assessed at 2.5 cm. Both tubal ostia were localized.  We change our diagnostic hysteroscope for a operative hysteroscope and proceed with the resection of the polyp. We then removed our instrumentation. Using a sharp curette, we proceed with curettage of the endometrial cavity which returns a small amount of normal-appearing endometrium.  Instruments are then removed. Instrument and sponge count is complete x2. Estimated blood loss is minimal. Water deficit is 1800 cc of Normal Saline.  The procedure is very well tolerated by the patient who is taken to recovery room in a well and stable condition.  Specimen: Endometrial polyp and endometrial curettings sent  to pathology.

## 2018-08-24 NOTE — Anesthesia Postprocedure Evaluation (Signed)
Anesthesia Post Note  Patient: Ann Boyle  Procedure(s) Performed: DILATATION & CURETTAGE/HYSTEROSCOPY WITH RESECTOCOPE (N/A Vagina )     Patient location during evaluation: PACU Anesthesia Type: General Level of consciousness: awake and alert Pain management: pain level controlled Vital Signs Assessment: post-procedure vital signs reviewed and stable Respiratory status: spontaneous breathing, nonlabored ventilation, respiratory function stable and patient connected to nasal cannula oxygen Cardiovascular status: blood pressure returned to baseline and stable Postop Assessment: no apparent nausea or vomiting Anesthetic complications: no    Last Vitals:  Vitals:   08/24/18 1545 08/24/18 1600  BP: (!) 112/50 109/73  Pulse: 84 83  Resp: 20 18  Temp:    SpO2: 100% 99%    Last Pain:  Vitals:   08/24/18 1545  TempSrc:   PainSc: Asleep   Pain Goal: Patients Stated Pain Goal: 3 (08/24/18 1155)               Montez Hageman

## 2018-08-24 NOTE — Discharge Instructions (Signed)
POST-OPERATIVE INSTRUCTIONS TO PATIENT  Call [facility]  [phone])  for excessive pain, bleeding or temperature greater than or equal to 100.4 degrees (orally).    No driving for 1 day No sexual activity until bleeding stops  Pain management:  Use Tylenol and or Aleve  as needed. Use your pain medication as needed to maintain a pain level at or below 3/10 Use Colace 1-2 capsules per day as long as you are using pain medication to avoid constipation.       Diet: normal  Bathing: may shower day after surgery  Return to Dr. Cletis Media as scheduled  Dede Query Rivard MD 08/24/2018 12:20 PM   DISCHARGE INSTRUCTIONS: D&C  The following instructions have been prepared to help you care for yourself upon your return home.   Personal hygiene:  Use sanitary pads for vaginal drainage, not tampons.  Shower the day after your procedure.  NO tub baths, pools or Jacuzzis for 2-3 weeks.  Wipe front to back after using the bathroom.  Activity and limitations:  Do NOT drive or operate any equipment for 24 hours. The effects of anesthesia are still present and drowsiness may result.  Do NOT rest in bed all day.  Walking is encouraged.  Walk up and down stairs slowly.  You may resume your normal activity in one to two days or as indicated by your physician.  Diet: Eat a light meal as desired this evening. You may resume your usual diet tomorrow.  Return to work: You may resume your work activities in one to two days or as indicated by your doctor.  What to expect after your surgery: Expect to have vaginal bleeding/discharge for 2-3 days and spotting for up to 10 days. It is not unusual to have soreness for up to 1-2 weeks. You may have a slight burning sensation when you urinate for the first day. Mild cramps may continue for a couple of days. You may have a regular period in 2-6 weeks.  Call your doctor for any of the following:  Excessive vaginal bleeding, saturating  and changing one pad every hour.  Inability to urinate 6 hours after discharge from hospital.  Pain not relieved by pain medication.  Fever of 100.4 F or greater.  Unusual vaginal discharge or odor.  Post Anesthesia Home Care Instructions  Activity: Get plenty of rest for the remainder of the day. A responsible individual must stay with you for 24 hours following the procedure.  For the next 24 hours, DO NOT: -Drive a car -Paediatric nurse -Drink alcoholic beverages -Take any medication unless instructed by your physician -Make any legal decisions or sign important papers.  Special Instructions/Symptoms: Your throat may feel dry or sore from the anesthesia or the breathing tube placed in your throat during surgery. If this causes discomfort, gargle with warm salt water. The discomfort should disappear within 24 hours.  If you had a scopolamine patch placed behind your ear for the management of post- operative nausea and/or vomiting:  1. The medication in the patch is effective for 72 hours, after which it should be removed.  Wrap patch in a tissue and discard in the trash. Wash hands thoroughly with soap and water. 2. You may remove the patch earlier than 72 hours if you experience unpleasant side effects which may include dry mouth, dizziness or visual disturbances. 3. Avoid touching the patch. Wash your hands with soap and water after contact with the patch.

## 2018-08-25 ENCOUNTER — Encounter (HOSPITAL_COMMUNITY): Payer: Self-pay | Admitting: Obstetrics and Gynecology

## 2018-09-06 DIAGNOSIS — D509 Iron deficiency anemia, unspecified: Secondary | ICD-10-CM | POA: Diagnosis not present

## 2018-09-06 DIAGNOSIS — Z0000 Encounter for general adult medical examination without abnormal findings: Secondary | ICD-10-CM | POA: Diagnosis not present

## 2018-09-06 DIAGNOSIS — M25562 Pain in left knee: Secondary | ICD-10-CM | POA: Diagnosis not present

## 2018-09-06 DIAGNOSIS — E7800 Pure hypercholesterolemia, unspecified: Secondary | ICD-10-CM | POA: Diagnosis not present

## 2018-09-07 DIAGNOSIS — N898 Other specified noninflammatory disorders of vagina: Secondary | ICD-10-CM | POA: Diagnosis not present

## 2018-09-07 DIAGNOSIS — Z09 Encounter for follow-up examination after completed treatment for conditions other than malignant neoplasm: Secondary | ICD-10-CM | POA: Diagnosis not present

## 2019-05-04 ENCOUNTER — Other Ambulatory Visit: Payer: Self-pay | Admitting: Pediatric Intensive Care

## 2019-05-04 DIAGNOSIS — Z20822 Contact with and (suspected) exposure to covid-19: Secondary | ICD-10-CM

## 2019-05-07 LAB — NOVEL CORONAVIRUS, NAA: SARS-CoV-2, NAA: NOT DETECTED

## 2019-06-08 DIAGNOSIS — L298 Other pruritus: Secondary | ICD-10-CM | POA: Diagnosis not present

## 2019-06-08 DIAGNOSIS — L669 Cicatricial alopecia, unspecified: Secondary | ICD-10-CM | POA: Diagnosis not present

## 2019-06-08 DIAGNOSIS — D2371 Other benign neoplasm of skin of right lower limb, including hip: Secondary | ICD-10-CM | POA: Diagnosis not present

## 2019-06-08 DIAGNOSIS — L810 Postinflammatory hyperpigmentation: Secondary | ICD-10-CM | POA: Diagnosis not present

## 2019-08-15 DIAGNOSIS — H9312 Tinnitus, left ear: Secondary | ICD-10-CM | POA: Diagnosis not present

## 2019-08-15 DIAGNOSIS — Z23 Encounter for immunization: Secondary | ICD-10-CM | POA: Diagnosis not present

## 2020-10-15 ENCOUNTER — Ambulatory Visit: Payer: Self-pay | Attending: Internal Medicine

## 2020-10-15 DIAGNOSIS — Z23 Encounter for immunization: Secondary | ICD-10-CM

## 2020-10-15 NOTE — Progress Notes (Signed)
   Covid-19 Vaccination Clinic  Name:  Ann Boyle    MRN: [id number] DOB: [birth year]  10/15/2020  [name] was observed post Covid-19 immunization for 15 minutes without incident. She was provided with Vaccine Information Sheet and instruction to access the V-Safe system.   [name] was instructed to call 911 with any severe reactions post vaccine: Marland Kitchen Difficulty breathing  . Swelling of face and throat  . A fast heartbeat  . A bad rash all over body  . Dizziness and weakness   Immunizations Administered    Name Date Dose VIS Date Route   Pfizer COVID-19 Vaccine 10/15/2020  5:53 PM 0.3 mL 08/29/2020 Intramuscular   Manufacturer: [place]   Lot: [id number]   [place]: [id number]

## 2021-04-02 DIAGNOSIS — Z1231 Encounter for screening mammogram for malignant neoplasm of breast: Secondary | ICD-10-CM | POA: Diagnosis not present

## 2021-04-02 DIAGNOSIS — Z3040 Encounter for surveillance of contraceptives, unspecified: Secondary | ICD-10-CM | POA: Diagnosis not present

## 2021-04-02 DIAGNOSIS — Z6820 Body mass index (BMI) 20.0-20.9, adult: Secondary | ICD-10-CM | POA: Diagnosis not present

## 2021-04-02 DIAGNOSIS — Z01419 Encounter for gynecological examination (general) (routine) without abnormal findings: Secondary | ICD-10-CM | POA: Diagnosis not present

## 2021-08-30 DIAGNOSIS — Z23 Encounter for immunization: Secondary | ICD-10-CM | POA: Diagnosis not present

## 2022-03-28 DIAGNOSIS — F411 Generalized anxiety disorder: Secondary | ICD-10-CM | POA: Diagnosis not present

## 2022-04-02 DIAGNOSIS — Z3040 Encounter for surveillance of contraceptives, unspecified: Secondary | ICD-10-CM | POA: Diagnosis not present

## 2022-04-02 DIAGNOSIS — Z1231 Encounter for screening mammogram for malignant neoplasm of breast: Secondary | ICD-10-CM | POA: Diagnosis not present

## 2022-04-02 DIAGNOSIS — Z1211 Encounter for screening for malignant neoplasm of colon: Secondary | ICD-10-CM | POA: Diagnosis not present

## 2022-04-02 DIAGNOSIS — Z01419 Encounter for gynecological examination (general) (routine) without abnormal findings: Secondary | ICD-10-CM | POA: Diagnosis not present

## 2022-04-02 LAB — HM MAMMOGRAPHY

## 2022-04-15 DIAGNOSIS — D509 Iron deficiency anemia, unspecified: Secondary | ICD-10-CM | POA: Diagnosis not present

## 2022-04-15 DIAGNOSIS — Z1211 Encounter for screening for malignant neoplasm of colon: Secondary | ICD-10-CM | POA: Diagnosis not present

## 2022-06-27 DIAGNOSIS — K5730 Diverticulosis of large intestine without perforation or abscess without bleeding: Secondary | ICD-10-CM | POA: Diagnosis not present

## 2022-06-27 DIAGNOSIS — Z1211 Encounter for screening for malignant neoplasm of colon: Secondary | ICD-10-CM | POA: Diagnosis not present

## 2022-06-27 LAB — HM COLONOSCOPY

## 2023-01-08 ENCOUNTER — Ambulatory Visit: Payer: No Typology Code available for payment source | Admitting: Nurse Practitioner

## 2023-01-08 ENCOUNTER — Encounter: Payer: Self-pay | Admitting: Nurse Practitioner

## 2023-01-08 VITALS — BP 102/60 | HR 75 | Temp 97.8°F | Ht 64.0 in | Wt 121.0 lb

## 2023-01-08 DIAGNOSIS — Z0000 Encounter for general adult medical examination without abnormal findings: Secondary | ICD-10-CM

## 2023-01-08 DIAGNOSIS — Z0001 Encounter for general adult medical examination with abnormal findings: Secondary | ICD-10-CM | POA: Diagnosis not present

## 2023-01-08 DIAGNOSIS — Z1159 Encounter for screening for other viral diseases: Secondary | ICD-10-CM | POA: Diagnosis not present

## 2023-01-08 DIAGNOSIS — Z114 Encounter for screening for human immunodeficiency virus [HIV]: Secondary | ICD-10-CM

## 2023-01-08 DIAGNOSIS — Z8679 Personal history of other diseases of the circulatory system: Secondary | ICD-10-CM

## 2023-01-08 DIAGNOSIS — Z1211 Encounter for screening for malignant neoplasm of colon: Secondary | ICD-10-CM

## 2023-01-08 DIAGNOSIS — E782 Mixed hyperlipidemia: Secondary | ICD-10-CM

## 2023-01-08 NOTE — Patient Instructions (Signed)
Staying healthy and adopting a healthy lifestyle for your overall health is important. You should eat 7 or more servings of fruits and vegetables per day. You should drink plenty of water to keep yourself hydrated and your kidneys healthy. This includes about 65-80+ fluid ounces of water. Limit your intake of animal fats especially for elevated cholesterol. Avoid highly processed food and limit your salt intake if you have hypertension. Avoid foods high in saturated/Trans fats. Along with a healthy diet it is also very important to maintain time for yourself to maintain a healthy mental health with low stress levels. You should get atleast 150 min of moderate intensity exercise weekly for a healthy heart. Along with eating right and exercising, aim for at least 7-9 hours of sleep daily.  Eat more whole grains which includes barley, wheat berries, oats, brown rice and whole wheat pasta. Use healthy plant oils which include olive, soy, corn, sunflower and peanut. Limit your caffeine and sugary drinks. Limit your intake of fast foods. Limit milk and dairy products to one or two daily servings.

## 2023-01-08 NOTE — Progress Notes (Signed)
I,Sheena H Holbrook,acting as a Education administrator for Ann Brine, FNP.,have documented all relevant documentation on the behalf of Ann Brine, FNP,as directed by  Ann Brine, FNP while in the presence of Ann Boyle, [place].   Subjective:     Patient ID: Ann Boyle , female    DOB: October 17, 1976 , 47 y.o.   MRN: [id number]   Chief Complaint  Patient presents with   Establish Care    HPI  Patient presents today to establish care. She was going to [employer] last visit December 2019. Married. She has 5 children 21 daughter, 43 daughter, 49 boy, 6 daughter, 5 boy. She is a stay at home mother.   Patient is transferring care from another provider. She has a history of hyperlipidemia, she does not want to take medications and no specific diet. She is a stay at home mother. She is followed by Dr. Cletis Media last seen in 2023, LMP - 12/22/2022.   Last total 313 and HDL 94-95. She has had elevated cholesterol since high school.   Mother - hyperlipidemia, hypertension, anemia father hyperlipidemia 2 sisters - healthy 1 brother - healthy         Past Medical History:  Diagnosis Date   Anemia    Complication of anesthesia    reaction to laughing gas   GERD (gastroesophageal reflux disease)    diet controlled - otc pepcid prn   Hyperlipidemia    diet controlled     History reviewed. No pertinent family history.   Current Outpatient Medications:    acetaminophen (TYLENOL) 500 MG tablet, Take 1,000 mg by mouth 4 (four) times daily as needed for mild pain, fever or headache. , Disp: , Rfl:    Carbonyl Iron (PERFECT IRON) 25 MG TABS, Take 25 mg by mouth daily., Disp: , Rfl:    Multiple Vitamins-Minerals (MULTIVITAMIN ADULT PO), Take 1 tablet by mouth daily., Disp: , Rfl:    atorvastatin (LIPITOR) 20 MG tablet, Take 1 tablet (20 mg total) by mouth daily., Disp: 90 tablet, Rfl: 1   No Known Allergies    The patient states she uses none for birth control. No LMP recorded.. Negative for  Dysmenorrhea and Negative for Menorrhagia. Negative for: breast discharge, breast lump(s), breast pain and breast self exam. Associated symptoms include abnormal vaginal bleeding. Pertinent negatives include abnormal bleeding (hematology), anxiety, decreased libido, depression, difficulty falling sleep, dyspareunia, history of infertility, nocturia, sexual dysfunction, sleep disturbances, urinary incontinence, urinary urgency, vaginal discharge and vaginal itching. Diet regular. She will cook french fries in air fryer. She eats a lot of cereal. The patient states her exercise level is minimal.   The patient's tobacco use is:  Social History   Tobacco Use  Smoking Status Never  Smokeless Tobacco Never   She has been exposed to passive smoke. The patient's alcohol use is:  Social History   Substance and Sexual Activity  Alcohol Use No   Additional information: Last pap unknown will get records from Dr. Cletis Media  Review of Systems  Constitutional: Negative.   HENT: Negative.    Eyes: Negative.   Respiratory: Negative.    Cardiovascular: Negative.   Gastrointestinal: Negative.   Endocrine: Negative.   Genitourinary: Negative.   Musculoskeletal: Negative.   Skin: Negative.   Allergic/Immunologic: Negative.   Neurological: Negative.   Hematological: Negative.   Psychiatric/Behavioral: Negative.       Today's Vitals   01/08/23 0953  BP: 102/60  Pulse: 75  Temp: 97.8 F (36.6 C)  TempSrc: Oral  SpO2: 96%  Weight: 121 lb (54.9 kg)  Height: '5\' 4"'$  (1.626 m)   Body mass index is 20.77 kg/m.   Objective:  Physical Exam Vitals reviewed.  Constitutional:      General: She is not in acute distress.    Appearance: Normal appearance. She is well-developed.  HENT:     Head: Normocephalic and atraumatic.     Right Ear: Hearing, tympanic membrane, ear canal and external ear normal. There is no impacted cerumen.     Left Ear: Hearing, tympanic membrane, ear canal and external ear  normal. There is no impacted cerumen.     Nose:     Comments: Deferred - masked    Mouth/Throat:     Comments: Deferred - masked Eyes:     General: Lids are normal.     Extraocular Movements: Extraocular movements intact.     Conjunctiva/sclera: Conjunctivae normal.     Pupils: Pupils are equal, round, and reactive to light.     Funduscopic exam:    Right eye: No papilledema.        Left eye: No papilledema.  Neck:     Thyroid: No thyroid mass.     Vascular: No carotid bruit.  Cardiovascular:     Rate and Rhythm: Normal rate and regular rhythm.     Pulses: Normal pulses.     Heart sounds: Normal heart sounds. No murmur heard. Pulmonary:     Effort: Pulmonary effort is normal.     Breath sounds: Normal breath sounds.  Chest:     Chest wall: No mass.  Breasts:    Tanner Score is 5.     Right: Normal. No mass or tenderness.     Left: Normal. No mass or tenderness.  Abdominal:     General: Abdomen is flat. Bowel sounds are normal. There is no distension.     Palpations: Abdomen is soft.     Tenderness: There is no abdominal tenderness.  Genitourinary:    Comments: Deferred followed by Dr. Cletis Media Musculoskeletal:        General: No swelling. Normal range of motion.     Cervical back: Full passive range of motion without pain, normal range of motion and neck supple.     Right lower leg: No edema.     Left lower leg: No edema.  Lymphadenopathy:     Upper Body:     Right upper body: No supraclavicular, axillary or pectoral adenopathy.     Left upper body: No supraclavicular, axillary or pectoral adenopathy.  Skin:    General: Skin is warm and dry.     Capillary Refill: Capillary refill takes less than 2 seconds.  Neurological:     General: No focal deficit present.     Mental Status: She is alert and oriented to person, place, and time.     Cranial Nerves: No cranial nerve deficit.     Sensory: No sensory deficit.  Psychiatric:        Mood and Affect: Mood normal.         Behavior: Behavior normal.        Thought Content: Thought content normal.        Judgment: Judgment normal.         Assessment And Plan:     1. Encounter for health maintenance examination Behavior modifications discussed and diet history reviewed.   Pt will continue to exercise regularly and modify diet with low GI, plant based foods and decrease intake of processed  foods.  Recommend intake of daily multivitamin, Vitamin D, and calcium.  Recommend mammogram and colonoscopy for preventive screenings, as well as recommend immunizations that include influenza, TDAP  2. Mixed hyperlipidemia Comments: She has always had an elevated cholesterol reported by patient.  Will check lipid profile.  Explained the risk of hyperlipidemia and ASCVD - Lipid panel - CMP14+EGFR  3. Encounter for screening colonoscopy According to USPTF Colorectal cancer Screening guidelines. Colonoscopy is recommended every 10 years, starting at age 68years. Will refer to GI for colon cancer screening.  4. Encounter for hepatitis C screening test for low risk patient Will check Hepatitis C screening due to recent recommendations to screen all adults 18 years and older - Hepatitis C antibody  5. Encounter for HIV (human immunodeficiency virus) test - HIV Antibody (routine testing w rflx)  6. History of aortic insufficiency Comments: Seen this diagnosis on review of patient chart.  Will consider referral back to urology to be evaluated as she has not seen urologist in several years   Patient was given opportunity to ask questions. Patient verbalized understanding of the plan and was able to repeat key elements of the plan. All questions were answered to their satisfaction.   Ann Brine, FNP   I, Ann Brine, FNP, have reviewed all documentation for this visit. The documentation on 01/08/23 for the exam, diagnosis, procedures, and orders are all accurate and complete.   THE PATIENT IS ENCOURAGED TO PRACTICE  SOCIAL DISTANCING DUE TO THE COVID-19 PANDEMIC.

## 2023-01-09 LAB — CMP14+EGFR
ALT: 14 IU/L (ref 0–32)
AST: 15 IU/L (ref 0–40)
Albumin/Globulin Ratio: 1.4 (ref 1.2–2.2)
Albumin: 4.7 g/dL (ref 3.9–4.9)
Alkaline Phosphatase: 63 IU/L (ref 44–121)
BUN/Creatinine Ratio: 15 (ref 9–23)
BUN: 14 mg/dL (ref 6–24)
Bilirubin Total: 0.3 mg/dL (ref 0.0–1.2)
CO2: 22 mmol/L (ref 20–29)
Calcium: 9.6 mg/dL (ref 8.7–10.2)
Chloride: 99 mmol/L (ref 96–106)
Creatinine, Ser: 0.94 mg/dL (ref 0.57–1.00)
Globulin, Total: 3.4 g/dL (ref 1.5–4.5)
Glucose: 84 mg/dL (ref 70–99)
Potassium: 4.1 mmol/L (ref 3.5–5.2)
Sodium: 136 mmol/L (ref 134–144)
Total Protein: 8.1 g/dL (ref 6.0–8.5)
eGFR: 76 mL/min/{1.73_m2} (ref >59)

## 2023-01-09 LAB — LIPID PANEL
Chol/HDL Ratio: 2.8 ratio (ref 0.0–4.4)
Cholesterol, Total: 347 mg/dL — ABNORMAL HIGH (ref 100–199)
HDL: 123 mg/dL (ref >39)
LDL Chol Calc (NIH): 216 mg/dL — ABNORMAL HIGH (ref 0–99)
Triglycerides: 66 mg/dL (ref 0–149)
VLDL Cholesterol Cal: 8 mg/dL (ref 5–40)

## 2023-01-09 LAB — HIV ANTIBODY (ROUTINE TESTING W REFLEX): HIV Screen 4th Generation wRfx: NONREACTIVE

## 2023-01-09 LAB — HEPATITIS C ANTIBODY: Hep C Virus Ab: NONREACTIVE

## 2023-01-12 ENCOUNTER — Other Ambulatory Visit: Payer: Self-pay | Admitting: Nurse Practitioner

## 2023-01-12 DIAGNOSIS — E782 Mixed hyperlipidemia: Secondary | ICD-10-CM

## 2023-01-12 NOTE — Progress Notes (Signed)
I have placed the referral for lipid clinic and Cierra if you can send atorvastatin 20 mg take at bedtime #30 tabs with 2 refills. She needs to let us know when she starts because she will need to have her labs rechecked in 8 weeks with a f/u.  Did she mention her moles during her visit because I will need to notate the concern?

## 2023-01-13 ENCOUNTER — Encounter: Payer: Self-pay | Admitting: Nurse Practitioner

## 2023-01-13 DIAGNOSIS — D229 Melanocytic nevi, unspecified: Secondary | ICD-10-CM

## 2023-01-13 DIAGNOSIS — L668 Other cicatricial alopecia: Secondary | ICD-10-CM

## 2023-01-14 ENCOUNTER — Other Ambulatory Visit: Payer: Self-pay

## 2023-01-14 MED ORDER — ATORVASTATIN CALCIUM 20 MG PO TABS: 20.0000 mg | ORAL_TABLET | Freq: Every day | ORAL | 1 refills | Status: DC

## 2023-02-15 ENCOUNTER — Other Ambulatory Visit: Payer: Self-pay | Admitting: Nurse Practitioner

## 2023-02-15 DIAGNOSIS — D229 Melanocytic nevi, unspecified: Secondary | ICD-10-CM

## 2023-03-25 ENCOUNTER — Ambulatory Visit: Payer: Self-pay | Admitting: Nurse Practitioner

## 2023-04-09 ENCOUNTER — Encounter: Payer: Self-pay | Admitting: Nurse Practitioner

## 2023-04-21 LAB — HM MAMMOGRAPHY

## 2023-05-01 LAB — HM PAP SMEAR: HPV, high-risk: NEGATIVE

## 2023-10-20 ENCOUNTER — Ambulatory Visit (HOSPITAL_BASED_OUTPATIENT_CLINIC_OR_DEPARTMENT_OTHER): Payer: No Typology Code available for payment source | Admitting: Internal Medicine

## 2023-10-20 ENCOUNTER — Encounter (HOSPITAL_BASED_OUTPATIENT_CLINIC_OR_DEPARTMENT_OTHER): Payer: Self-pay | Admitting: Internal Medicine

## 2023-10-20 VITALS — BP 96/60 | HR 78 | Ht 64.0 in | Wt 120.6 lb

## 2023-10-20 DIAGNOSIS — E7849 Other hyperlipidemia: Secondary | ICD-10-CM

## 2023-10-20 NOTE — Patient Instructions (Addendum)
Medication Instructions:  NO CHANGES  *If you need a refill on your cardiac medications before your next appointment, please call your pharmacy*   Testing/Procedures: Dr. Rennis Golden has ordered a CT coronary calcium score.   Test locations:  [facility]  [place]  This is $99 out of pocket.   Coronary CalciumScan A coronary calcium scan is an imaging test used to look for deposits of calcium and other fatty materials (plaques) in the inner lining of the blood vessels of the heart (coronary arteries). These deposits of calcium and plaques can partly clog and narrow the coronary arteries without producing any symptoms or warning signs. This puts a person at risk for a heart attack. This test can detect these deposits before symptoms develop. Tell a health care provider about: Any allergies you have. All medicines you are taking, including vitamins, herbs, eye drops, creams, and over-the-counter medicines. Any problems you or family members have had with anesthetic medicines. Any blood disorders you have. Any surgeries you have had. Any medical conditions you have. Whether you are pregnant or may be pregnant. What are the risks? Generally, this is a safe procedure. However, problems may occur, including: Harm to a pregnant woman and her unborn baby. This test involves the use of radiation. Radiation exposure can be dangerous to a pregnant woman and her unborn baby. If you are pregnant, you generally should not have this procedure done. Slight increase in the risk of cancer. This is because of the radiation involved in the test. What happens before the procedure? No preparation is needed for this procedure. What happens during the procedure? You will undress and remove any jewelry around your neck or chest. You will put on a hospital gown. Sticky electrodes will be placed on your chest. The  electrodes will be connected to an electrocardiogram (ECG) machine to record a tracing of the electrical activity of your heart. A CT scanner will take pictures of your heart. During this time, you will be asked to lie still and hold your breath for 2-3 seconds while a picture of your heart is being taken. The procedure may vary among health care providers and hospitals. What happens after the procedure? You can get dressed. You can return to your normal activities. It is up to you to get the results of your test. Ask your health care provider, or the department that is doing the test, when your results will be ready. Summary A coronary calcium scan is an imaging test used to look for deposits of calcium and other fatty materials (plaques) in the inner lining of the blood vessels of the heart (coronary arteries). Generally, this is a safe procedure. Tell your health care provider if you are pregnant or may be pregnant. No preparation is needed for this procedure. A CT scanner will take pictures of your heart. You can return to your normal activities after the scan is done. This information is not intended to replace advice given to you by your health care provider. Make sure you discuss any questions you have with your health care provider. Document Released: 04/24/2008 Document Revised: 09/15/2016 Document Reviewed: 09/15/2016 Elsevier Interactive Patient Education  2017 [employer].  If interested in genetic testing: CPT codes for the Dyslipidemia and ASCVD panel are as follows: 81401, 81405, 81406.  Diagnosis code: Familial Hyperlipidemia E78.49   Follow-Up: At [facility], you and your health needs are our priority.  As  part of our continuing mission to provide you with exceptional heart care, we have created designated Provider Care Teams.  These Care Teams include your primary Cardiologist (physician) and Advanced Practice Providers (APPs -  Physician Assistants and Nurse  Practitioners) who all work together to provide you with the care you need, when you need it.  We recommend signing up for the patient portal called "MyChart".  Sign up information is provided on this After Visit Summary.  MyChart is used to connect with patients for Virtual Visits (Telemedicine).  Patients are able to view lab/test results, encounter notes, upcoming appointments, etc.  Non-urgent messages can be sent to your provider as well.   To learn more about what you can do with MyChart, go to [email or web address].    Your next appointment:    AS NEEDED -- pending results

## 2023-10-20 NOTE — Progress Notes (Signed)
LIPID CLINIC CONSULT NOTE  Chief Complaint:  Possible familial hyperlipidemia  Primary Care Physician: Ann Felts, FNP  Primary Cardiologist:  None  HPI:  Ann Boyle is a 47 y.o. female who is being seen today for the evaluation of familial hyperlipidemia at the request of Ann Felts, FNP.  This is a pleasant 47 year old female kindly referred for evaluation management of possible familial hyperlipidemia.  She reports high cholesterol in her family including her mom and her sisters but no early onset heart disease.  Other than that she has no significant history of medical problems.  She has had some reflux as well as high cholesterol that she reports she is known about since she was a teenager.  She was evaluated by one of my partners Dr. Graciela Boyle for chest pain.  She was thought to have aortopathy and underwent a cardiac MRI back in 2011 which was reassuringly normal.  She does not even recall this.  She has no high blood pressure, diabetes or other traditional risk factors.  She is of normal weight and generally eats a pretty healthy diet low in saturated fats and cholesterol.  Recent lipids in February of this year however showed total cholesterol 347, HDL 123, triglycerides 66 and LDL 216.  She has generally felt that her cholesterol ratio has been excellent and that her high HDL has been cardioprotective for her.  Her PCP had recommended starting statin therapy and prescribed atorvastatin but she never had the prescription filled.  PMHx:  Past Medical History:  Diagnosis Date   Anemia    Complication of anesthesia    reaction to laughing gas   GERD (gastroesophageal reflux disease)    diet controlled - otc pepcid prn   Hyperlipidemia    diet controlled    Past Surgical History:  Procedure Laterality Date   DILATATION & CURRETTAGE/HYSTEROSCOPY WITH RESECTOCOPE N/A 08/24/2018   Procedure: DILATATION & CURETTAGE/HYSTEROSCOPY WITH RESECTOCOPE;  Surgeon: Ann Lay, MD;   Location: [facility];  Service: Gynecology;  Laterality: N/A;   UPPER GI ENDOSCOPY     gerd   WISDOM TOOTH EXTRACTION     at age 84    FAMHx:  Family History  Problem Relation Age of Onset   Hyperlipidemia Mother    Hyperlipidemia Sister     SOCHx:   reports that she has never smoked. She has never used smokeless tobacco. She reports that she does not drink alcohol and does not use drugs.  ALLERGIES:  No Known Allergies  ROS: Pertinent items noted in HPI and remainder of comprehensive ROS otherwise negative.  HOME MEDS: Current Outpatient Medications on File Prior to Visit  Medication Sig Dispense Refill   acetaminophen (TYLENOL) 500 MG tablet Take 1,000 mg by mouth 4 (four) times daily as needed for mild pain, fever or headache.      Carbonyl Iron (PERFECT IRON) 25 MG TABS Take 25 mg by mouth daily.     Multiple Vitamins-Minerals (MULTIVITAMIN ADULT PO) Take 1 tablet by mouth daily.     No current facility-administered medications on file prior to visit.    LABS/IMAGING: No results found for this or any previous visit (from the past 48 hour(s)). No results found.  LIPID PANEL:    Component Value Date/Time   CHOL 347 (H) 01/08/2023 1117   TRIG 66 01/08/2023 1117   HDL 123 01/08/2023 1117   CHOLHDL 2.8 01/08/2023 1117   LDLCALC 216 (H) 01/08/2023 1117    WEIGHTS: Wt Readings from Last 3  Encounters:  10/20/23 120 lb 9.6 oz (54.7 kg)  01/08/23 121 lb (54.9 kg)  08/24/18 118 lb (53.5 kg)    VITALS: BP 96/60 (BP Location: Left Arm, Patient Position: Sitting, Cuff Size: Normal)   Pulse 78   Ht 5\' 4"  (1.626 m)   Wt 120 lb 9.6 oz (54.7 kg)   SpO2 97%   BMI 20.70 kg/m   EXAM: Deferred  EKG: Deferred  ASSESSMENT: Dyslipidemia with LDL greater than 190 and high HDL, possible familial hyperlipidemia by Ann Boyle criteria Sister and mother with high cholesterol but no early onset heart disease  PLAN: 1.   [name] has a very high LDL cholesterol but also  high HDL cholesterol.  This can be seen in familial hyperlipidemia's.  I think it would be better to further risk stratify her as she is very hesitant to start therapy particularly statins.  There is no early onset heart disease in her family that she is aware of.  Would advise a calcium score to assess for any possible coronary artery disease.  In addition she might benefit from genetic testing.  She understands that the calcium score is a $99 out-of-pocket cost.  Genetic testing could be up to $299 but usually is covered by [employer].  In light of this, she would like to reach out to insurance first to see if it would be covered I would likely not consider doing the testing until early next year.  Will plan follow-up as needed if she wishes to start any therapy based on further restratification.  Thanks again for the kind referral.  Ann Nose, MD, [facility]  [place]  [facility]  Medical Director of the [facility]  Cardiovascular Risk Reduction Clinic Diplomate of the American Board of Clinical Lipidology Attending Cardiologist  Direct Dial: [phone]  Fax: [phone]  Website:  [email or web address] 10/20/2023, 12:38 PM

## 2023-10-30 ENCOUNTER — Ambulatory Visit (HOSPITAL_COMMUNITY)
Admission: RE | Admit: 2023-10-30 | Discharge: 2023-10-30 | Disposition: A | Discharge status: Home / Self Care | Payer: Self-pay | Source: Ambulatory Visit | Attending: Internal Medicine | Admitting: Internal Medicine

## 2023-10-30 DIAGNOSIS — E7849 Other hyperlipidemia: Secondary | ICD-10-CM | POA: Insufficient documentation

## 2023-11-13 ENCOUNTER — Other Ambulatory Visit (HOSPITAL_COMMUNITY): Payer: No Typology Code available for payment source

## 2023-11-20 ENCOUNTER — Telehealth: Payer: Self-pay | Admitting: Internal Medicine

## 2023-11-20 NOTE — Telephone Encounter (Signed)
 Pt called in asking to speak with someone from out billing dept about a bill from her appt 10/20/23

## 2023-11-20 NOTE — Telephone Encounter (Signed)
 Returned call to patient and went over the visit with her.  I also emailed [employer] billing regarding how it was processed.  She understood why the estimate does not apply to her balance, as it is only for self-pay accounts.

## 2023-11-27 ENCOUNTER — Telehealth: Payer: Self-pay | Admitting: Nurse Practitioner

## 2023-11-27 NOTE — Telephone Encounter (Signed)
CALLED PT TO SCHEDULE PHY SHE SAID SHE WOULD CALL BACK TO SCHEDULE.

## 2024-01-14 ENCOUNTER — Encounter: Payer: No Typology Code available for payment source | Admitting: Nurse Practitioner

## 2024-02-09 ENCOUNTER — Encounter: Payer: Self-pay | Admitting: Nurse Practitioner

## 2024-02-09 ENCOUNTER — Ambulatory Visit (INDEPENDENT_AMBULATORY_CARE_PROVIDER_SITE_OTHER): Payer: No Typology Code available for payment source | Admitting: Nurse Practitioner

## 2024-02-09 VITALS — BP 112/60 | HR 95 | Temp 98.6°F | Ht 64.0 in | Wt 122.4 lb

## 2024-02-09 DIAGNOSIS — Z2821 Immunization not carried out because of patient refusal: Secondary | ICD-10-CM

## 2024-02-09 DIAGNOSIS — Z13228 Encounter for screening for other metabolic disorders: Secondary | ICD-10-CM

## 2024-02-09 DIAGNOSIS — Z0000 Encounter for general adult medical examination without abnormal findings: Secondary | ICD-10-CM

## 2024-02-09 DIAGNOSIS — E782 Mixed hyperlipidemia: Secondary | ICD-10-CM | POA: Diagnosis not present

## 2024-02-09 MED ORDER — EZETIMIBE 10 MG PO TABS: 10.0000 mg | ORAL_TABLET | Freq: Every day | ORAL | 2 refills | Status: DC

## 2024-02-09 NOTE — Progress Notes (Signed)
 Del Favia, CMA,acting as a Neurosurgeon for Susanna Epley, FNP.,have documented all relevant documentation on the behalf of Susanna Epley, FNP,as directed by  Susanna Epley, FNP while in the presence of Susanna Epley, FNP.  Subjective:    Patient ID: Ann Boyle , female    DOB: 08-26-1976 , 48 y.o.   MRN: [id number]  Chief Complaint  Patient presents with   Annual Exam    HPI  Patient presents today for HM, Patient reports compliance with medication. Patient denies any chest pain, SOB, or headaches. Patient has no concerns today.      Past Medical History:  Diagnosis Date   Anemia    Complication of anesthesia    reaction to laughing gas   GERD (gastroesophageal reflux disease)    diet controlled - otc pepcid prn   Hyperlipidemia    diet controlled     Family History  Problem Relation Age of Onset   Hyperlipidemia Mother    Hyperlipidemia Sister      Current Outpatient Medications:    acetaminophen (TYLENOL) 500 MG tablet, Take 1,000 mg by mouth 4 (four) times daily as needed for mild pain, fever or headache. , Disp: , Rfl:    Carbonyl Iron (PERFECT IRON) 25 MG TABS, Take 25 mg by mouth daily., Disp: , Rfl:    ezetimibe (ZETIA) 10 MG tablet, Take 1 tablet (10 mg total) by mouth daily., Disp: 30 tablet, Rfl: 2   Multiple Vitamins-Minerals (MULTIVITAMIN ADULT PO), Take 1 tablet by mouth daily., Disp: , Rfl:    No Known Allergies    The patient states she uses none for birth control. Patient's last menstrual period was 01/13/2024.. Negative for Dysmenorrhea and Negative for Menorrhagia. Negative for: breast discharge, breast lump(s), breast pain and breast self exam. Associated symptoms include abnormal vaginal bleeding. Pertinent negatives include abnormal bleeding (hematology), anxiety, decreased libido, depression, difficulty falling sleep, dyspareunia, history of infertility, nocturia, sexual dysfunction, sleep disturbances, urinary incontinence, urinary urgency, vaginal  discharge and vaginal itching. Diet regular; avoids cereal. She does realize she is not getting all the fruits and vegetables. She has cut back on fried and fatty foods. The patient states her exercise level is minimal - at least once a week.   The patient's tobacco use is:  Social History   Tobacco Use  Smoking Status Never  Smokeless Tobacco Never  . She has been exposed to passive smoke. The patient's alcohol use is:  Social History   Substance and Sexual Activity  Alcohol Use No   Additional information: Last pap 05/01/2023, next one scheduled for 04/30/2026.    Review of Systems  Constitutional: Negative.   HENT: Negative.    Eyes: Negative.   Respiratory: Negative.    Cardiovascular: Negative.   Gastrointestinal: Negative.   Endocrine: Negative.   Genitourinary: Negative.   Musculoskeletal: Negative.   Skin: Negative.   Allergic/Immunologic: Negative.   Neurological: Negative.   Hematological: Negative.   Psychiatric/Behavioral: Negative.       Today's Vitals   02/09/24 1530  BP: 112/60  Pulse: 95  Temp: 98.6 F (37 C)  TempSrc: Oral  Weight: 122 lb 6.4 oz (55.5 kg)  Height: 5\' 4"  (1.626 m)  PainSc: 0-No pain   Body mass index is 21.01 kg/m.  Wt Readings from Last 3 Encounters:  02/09/24 122 lb 6.4 oz (55.5 kg)  10/20/23 120 lb 9.6 oz (54.7 kg)  01/08/23 121 lb (54.9 kg)     Objective:  Physical Exam Vitals reviewed.  Constitutional:      General: She is not in acute distress.    Appearance: Normal appearance. She is well-developed.  HENT:     Head: Normocephalic and atraumatic.     Right Ear: Hearing, tympanic membrane, ear canal and external ear normal. There is no impacted cerumen.     Left Ear: Hearing, tympanic membrane, ear canal and external ear normal. There is no impacted cerumen.     Nose: Nose normal.     Mouth/Throat:     Mouth: Mucous membranes are moist.  Eyes:     General: Lids are normal.     Extraocular Movements: Extraocular  movements intact.     Conjunctiva/sclera: Conjunctivae normal.     Pupils: Pupils are equal, round, and reactive to light.     Funduscopic exam:    Right eye: No papilledema.        Left eye: No papilledema.  Neck:     Thyroid: No thyroid mass.     Vascular: No carotid bruit.  Cardiovascular:     Rate and Rhythm: Normal rate and regular rhythm.     Pulses: Normal pulses.     Heart sounds: Normal heart sounds. No murmur heard. Pulmonary:     Effort: Pulmonary effort is normal. No respiratory distress.     Breath sounds: Normal breath sounds. No wheezing.  Chest:     Chest wall: No mass.  Breasts:    Tanner Score is 5.     Right: Normal. No mass or tenderness.     Left: Normal. No mass or tenderness.  Abdominal:     General: Abdomen is flat. Bowel sounds are normal. There is no distension.     Palpations: Abdomen is soft.     Tenderness: There is no abdominal tenderness.  Genitourinary:    Comments: Deferred followed by Dr. Duke Gibbons Musculoskeletal:        General: No swelling. Normal range of motion.     Cervical back: Full passive range of motion without pain, normal range of motion and neck supple.     Right lower leg: No edema.     Left lower leg: No edema.  Lymphadenopathy:     Upper Body:     Right upper body: No supraclavicular, axillary or pectoral adenopathy.     Left upper body: No supraclavicular, axillary or pectoral adenopathy.  Skin:    General: Skin is warm and dry.     Capillary Refill: Capillary refill takes less than 2 seconds.  Neurological:     General: No focal deficit present.     Mental Status: She is alert and oriented to person, place, and time.     Cranial Nerves: No cranial nerve deficit.     Sensory: No sensory deficit.  Psychiatric:        Mood and Affect: Mood normal.        Behavior: Behavior normal.        Thought Content: Thought content normal.        Judgment: Judgment normal.         Assessment And Plan:     Encounter for annual  health examination Assessment & Plan: Behavior modifications discussed and diet history reviewed.   Pt will continue to exercise regularly and modify diet with low GI, plant based foods and decrease intake of processed foods.  Recommend intake of daily multivitamin, Vitamin D, and calcium.  Recommend mammogram and colonoscopy for preventive screenings, as well as recommend immunizations that include influenza,  TDAP   Orders: -     CBC with Differential/Platelet -     VITAMIN D 25 Hydroxy (Vit-D Deficiency, Fractures) -     TSH  Mixed hyperlipidemia Assessment & Plan: Continues to have elevated cholesterol, she has seen cardiology continues to not be on medications  Orders: -     CMP14+EGFR -     Lipid panel -     Ezetimibe; Take 1 tablet (10 mg total) by mouth daily.  Dispense: 30 tablet; Refill: 2 -     Lipoprotein A (LPA)  COVID-19 vaccination declined Assessment & Plan: Declines covid 19 vaccine. Discussed risk of covid 65 and if she changes her mind about the vaccine to call the office. Education has been provided regarding the importance of this vaccine but patient still declined. Advised may receive this vaccine at local pharmacy or Health Dept.or vaccine clinic. Aware to provide a copy of the vaccination record if obtained from local pharmacy or Health Dept.  Encouraged to take multivitamin, vitamin d, vitamin c and zinc to increase immune system. Aware can call office if would like to have vaccine here at office. Verbalized acceptance and understanding.     Encounter for screening for metabolic disorder -     Hemoglobin A1c     Return for 1 year physical, 6 month chol check; 4-6 weeks f/u new med and liver profile. Patient was given opportunity to ask questions. Patient verbalized understanding of the plan and was able to repeat key elements of the plan. All questions were answered to their satisfaction.   Susanna Epley, FNP  I, Susanna Epley, FNP, have reviewed all  documentation for this visit. The documentation on 02/09/24 for the exam, diagnosis, procedures, and orders are all accurate and complete.

## 2024-02-10 LAB — CBC WITH DIFFERENTIAL/PLATELET
Basophils Absolute: 0 10*3/uL (ref 0.0–0.2)
Basos: 1 %
EOS (ABSOLUTE): 0.1 10*3/uL (ref 0.0–0.4)
Eos: 2 %
Hematocrit: 34 % (ref 34.0–46.6)
Hemoglobin: 11.4 g/dL (ref 11.1–15.9)
Immature Grans (Abs): 0 10*3/uL (ref 0.0–0.1)
Immature Granulocytes: 0 %
Lymphocytes Absolute: 1.6 10*3/uL (ref 0.7–3.1)
Lymphs: 49 %
MCH: 31.4 pg (ref 26.6–33.0)
MCHC: 33.5 g/dL (ref 31.5–35.7)
MCV: 94 fL (ref 79–97)
Monocytes Absolute: 0.3 10*3/uL (ref 0.1–0.9)
Monocytes: 8 %
Neutrophils Absolute: 1.3 10*3/uL — ABNORMAL LOW (ref 1.4–7.0)
Neutrophils: 40 %
Platelets: 182 10*3/uL (ref 150–450)
RBC: 3.63 x10E6/uL — ABNORMAL LOW (ref 3.77–5.28)
RDW: 12.3 % (ref 11.7–15.4)
WBC: 3.2 10*3/uL — ABNORMAL LOW (ref 3.4–10.8)

## 2024-02-10 LAB — LIPID PANEL
Chol/HDL Ratio: 3 ratio (ref 0.0–4.4)
Cholesterol, Total: 300 mg/dL — ABNORMAL HIGH (ref 100–199)
HDL: 101 mg/dL (ref >39)
LDL Chol Calc (NIH): 191 mg/dL — ABNORMAL HIGH (ref 0–99)
Triglycerides: 57 mg/dL (ref 0–149)
VLDL Cholesterol Cal: 8 mg/dL (ref 5–40)

## 2024-02-10 LAB — CMP14+EGFR
ALT: 14 IU/L (ref 0–32)
AST: 14 IU/L (ref 0–40)
Albumin: 4.3 g/dL (ref 3.9–4.9)
Alkaline Phosphatase: 56 IU/L (ref 44–121)
BUN/Creatinine Ratio: 16 (ref 9–23)
BUN: 12 mg/dL (ref 6–24)
Bilirubin Total: 0.4 mg/dL (ref 0.0–1.2)
CO2: 23 mmol/L (ref 20–29)
Calcium: 9.3 mg/dL (ref 8.7–10.2)
Chloride: 100 mmol/L (ref 96–106)
Creatinine, Ser: 0.76 mg/dL (ref 0.57–1.00)
Globulin, Total: 2.9 g/dL (ref 1.5–4.5)
Glucose: 74 mg/dL (ref 70–99)
Potassium: 4.1 mmol/L (ref 3.5–5.2)
Sodium: 139 mmol/L (ref 134–144)
Total Protein: 7.2 g/dL (ref 6.0–8.5)
eGFR: 97 mL/min/{1.73_m2} (ref >59)

## 2024-02-10 LAB — VITAMIN D 25 HYDROXY (VIT D DEFICIENCY, FRACTURES): Vit D, 25-Hydroxy: 43.2 ng/mL (ref 30.0–100.0)

## 2024-02-10 LAB — HEMOGLOBIN A1C
Est. average glucose Bld gHb Est-mCnc: 111 mg/dL
Hgb A1c MFr Bld: 5.5 % (ref 4.8–5.6)

## 2024-02-10 LAB — LIPOPROTEIN A (LPA): Lipoprotein (a): 293.5 nmol/L — ABNORMAL HIGH (ref <75.0)

## 2024-02-10 LAB — TSH: TSH: 2.16 u[IU]/mL (ref 0.450–4.500)

## 2024-02-15 ENCOUNTER — Other Ambulatory Visit: Payer: Self-pay | Admitting: Obstetrics and Gynecology

## 2024-02-15 DIAGNOSIS — Z1231 Encounter for screening mammogram for malignant neoplasm of breast: Secondary | ICD-10-CM

## 2024-02-22 ENCOUNTER — Encounter: Payer: Self-pay | Admitting: Nurse Practitioner

## 2024-02-22 DIAGNOSIS — Z0000 Encounter for general adult medical examination without abnormal findings: Secondary | ICD-10-CM | POA: Insufficient documentation

## 2024-02-22 DIAGNOSIS — E782 Mixed hyperlipidemia: Secondary | ICD-10-CM | POA: Insufficient documentation

## 2024-02-22 DIAGNOSIS — Z2821 Immunization not carried out because of patient refusal: Secondary | ICD-10-CM | POA: Insufficient documentation

## 2024-02-22 NOTE — Assessment & Plan Note (Signed)

## 2024-02-22 NOTE — Assessment & Plan Note (Signed)

## 2024-02-22 NOTE — Assessment & Plan Note (Signed)
 Continues to have elevated cholesterol, she has seen cardiology continues to not be on medications

## 2024-03-15 ENCOUNTER — Ambulatory Visit: Admitting: Nurse Practitioner

## 2024-03-21 ENCOUNTER — Encounter: Payer: Self-pay | Admitting: Nurse Practitioner

## 2024-03-21 ENCOUNTER — Ambulatory Visit: Admitting: Nurse Practitioner

## 2024-03-21 VITALS — BP 120/60 | HR 65 | Temp 97.6°F | Ht 64.0 in | Wt 123.6 lb

## 2024-03-21 DIAGNOSIS — E782 Mixed hyperlipidemia: Secondary | ICD-10-CM

## 2024-03-21 DIAGNOSIS — Z2821 Immunization not carried out because of patient refusal: Secondary | ICD-10-CM

## 2024-03-21 DIAGNOSIS — D72818 Other decreased white blood cell count: Secondary | ICD-10-CM

## 2024-03-21 NOTE — Progress Notes (Signed)
 Del Favia, CMA,acting as a Neurosurgeon for Ann Epley, FNP.,have documented all relevant documentation on the behalf of Ann Epley, FNP,as directed by  Ann Epley, FNP while in the presence of Ann Epley, FNP.  Subjective:  Patient ID: Ann Boyle , female    DOB: [birth year] , 48 y.o.   MRN: [id number]  Chief Complaint  Patient presents with   Hyperlipidemia    Patient presents today for a cholesterol  follow up, Patient reports compliance with medication. Patient denies any chest pain, SOB, or headaches. Patient has no concerns today.     HPI  She is taking ezetimibe , tolerating well. She takes most days will miss occasionally.      Past Medical History:  Diagnosis Date   Anemia    Complication of anesthesia    reaction to laughing gas   GERD (gastroesophageal reflux disease)    diet controlled - otc pepcid prn   Hyperlipidemia    diet controlled     Family History  Problem Relation Age of Onset   Hyperlipidemia Mother    Hyperlipidemia Sister      Current Outpatient Medications:    acetaminophen (TYLENOL) 500 MG tablet, Take 1,000 mg by mouth 4 (four) times daily as needed for mild pain, fever or headache. , Disp: , Rfl:    Carbonyl Iron (PERFECT IRON) 25 MG TABS, Take 25 mg by mouth daily., Disp: , Rfl:    ezetimibe  (ZETIA ) 10 MG tablet, Take 1 tablet (10 mg total) by mouth daily., Disp: 30 tablet, Rfl: 2   Multiple Vitamins-Minerals (MULTIVITAMIN ADULT PO), Take 1 tablet by mouth daily., Disp: , Rfl:    No Known Allergies   Review of Systems  Constitutional: Negative.  Negative for activity change and fatigue.  Respiratory: Negative.  Negative for choking, shortness of breath and wheezing.   Cardiovascular: Negative.  Negative for chest pain, palpitations and leg swelling.  Gastrointestinal: Negative.   Musculoskeletal: Negative.   Skin: Negative.   Neurological:  Negative for dizziness, weakness and headaches.  Psychiatric/Behavioral:  Negative for  confusion. The patient is not nervous/anxious.      Today's Vitals   03/21/24 1602  BP: 120/60  Pulse: 65  Temp: 97.6 F (36.4 C)  TempSrc: Oral  Weight: 123 lb 9.6 oz (56.1 kg)  Height: 5\' 4"  (1.626 m)  PainSc: 0-No pain   Body mass index is 21.22 kg/m.  Wt Readings from Last 3 Encounters:  03/21/24 123 lb 9.6 oz (56.1 kg)  02/09/24 122 lb 6.4 oz (55.5 kg)  10/20/23 120 lb 9.6 oz (54.7 kg)    The ASCVD Risk score (Arnett DK, et al., 2019) failed to calculate for the following reasons:   The valid HDL cholesterol range is 20 to 100 mg/dL  Objective:  Physical Exam Vitals and nursing note reviewed.  Constitutional:      Appearance: She is well-developed.  HENT:     Head: Normocephalic and atraumatic.  Cardiovascular:     Rate and Rhythm: Normal rate and regular rhythm.     Pulses: Normal pulses.     Heart sounds: Normal heart sounds. No murmur heard. Pulmonary:     Effort: Pulmonary effort is normal. No respiratory distress.     Breath sounds: Normal breath sounds. No wheezing.  Musculoskeletal:        General: Normal range of motion.  Skin:    General: Skin is warm and dry.     Capillary Refill: Capillary refill takes less than 2 seconds.  Neurological:     General: No focal deficit present.     Mental Status: She is alert and oriented to person, place, and time.     Cranial Nerves: No cranial nerve deficit.     Motor: No weakness.  Psychiatric:        Mood and Affect: Mood normal.        Behavior: Behavior normal.        Thought Content: Thought content normal.        Judgment: Judgment normal.         Assessment And Plan:  Mixed hyperlipidemia Assessment & Plan: Continues to have elevated cholesterol, she has not taken the statin we will repeat her liver functions.  She is tolerating well.  Orders: -     Hepatic function panel  COVID-19 vaccination declined Assessment & Plan: Declines covid 19 vaccine. Discussed risk of covid 63 and if she changes  her mind about the vaccine to call the office. Education has been provided regarding the importance of this vaccine but patient still declined. Advised may receive this vaccine at local pharmacy or Health Dept.or vaccine clinic. Aware to provide a copy of the vaccination record if obtained from local pharmacy or Health Dept.  Encouraged to take multivitamin, vitamin d , vitamin c and zinc to increase immune system. Aware can call office if would like to have vaccine here at office. Verbalized acceptance and understanding.     Other decreased white blood cell (WBC) count Assessment & Plan: She has had a low white blood cell count with the lowest at 3.2, will refer to hematology for evaluation.  Did explain that often at [facility] will have a low white count and it is a benign finding.  Orders: -     Ambulatory referral to Hematology / Oncology    Return for keep same next.  Patient was given opportunity to ask questions. Patient verbalized understanding of the plan and was able to repeat key elements of the plan. All questions were answered to their satisfaction.    Inge Mangle, FNP, have reviewed all documentation for this visit. The documentation on 03/21/24 for the exam, diagnosis, procedures, and orders are all accurate and complete.   IF YOU HAVE BEEN REFERRED TO A SPECIALIST, IT MAY TAKE 1-2 WEEKS TO SCHEDULE/PROCESS THE REFERRAL. IF YOU HAVE NOT HEARD FROM US /SPECIALIST IN TWO WEEKS, PLEASE GIVE US  A CALL AT [phone].

## 2024-03-22 ENCOUNTER — Ambulatory Visit: Payer: Self-pay | Admitting: Nurse Practitioner

## 2024-03-22 LAB — HEPATIC FUNCTION PANEL
ALT: 14 IU/L (ref 0–32)
AST: 17 IU/L (ref 0–40)
Albumin: 4.3 g/dL (ref 3.9–4.9)
Alkaline Phosphatase: 57 IU/L (ref 44–121)
Bilirubin Total: 0.3 mg/dL (ref 0.0–1.2)
Bilirubin, Direct: 0.1 mg/dL (ref 0.00–0.40)
Total Protein: 7 g/dL (ref 6.0–8.5)

## 2024-03-30 ENCOUNTER — Encounter: Payer: Self-pay | Admitting: Nurse Practitioner

## 2024-03-30 DIAGNOSIS — D72819 Decreased white blood cell count, unspecified: Secondary | ICD-10-CM | POA: Insufficient documentation

## 2024-03-30 NOTE — Assessment & Plan Note (Signed)

## 2024-03-30 NOTE — Assessment & Plan Note (Signed)
 She has had a low white blood cell count with the lowest at 3.2, will refer to hematology for evaluation.  Did explain that often at [facility] will have a low white count and it is a benign finding.

## 2024-03-30 NOTE — Assessment & Plan Note (Signed)
 Continues to have elevated cholesterol, she has not taken the statin we will repeat her liver functions.  She is tolerating well.

## 2024-04-22 ENCOUNTER — Ambulatory Visit
Admission: RE | Admit: 2024-04-22 | Discharge: 2024-04-22 | Disposition: A | Discharge status: Home / Self Care | Source: Ambulatory Visit | Attending: Obstetrics and Gynecology | Admitting: Obstetrics and Gynecology

## 2024-04-22 DIAGNOSIS — Z1231 Encounter for screening mammogram for malignant neoplasm of breast: Secondary | ICD-10-CM

## 2024-04-29 ENCOUNTER — Telehealth: Payer: Self-pay | Admitting: Internal Medicine

## 2024-04-29 NOTE — Telephone Encounter (Signed)
 Pt is returning call to nurse.

## 2024-04-29 NOTE — Telephone Encounter (Signed)
 Patient is returning call. Please advise?

## 2024-04-29 NOTE — Telephone Encounter (Signed)
 Pt would like to have genetic testing done. Please advise

## 2024-04-29 NOTE — Telephone Encounter (Signed)
 Left voicemail to return call to office

## 2024-05-02 ENCOUNTER — Inpatient Hospital Stay

## 2024-05-02 ENCOUNTER — Encounter: Payer: Self-pay | Admitting: Hematology and Oncology

## 2024-05-02 ENCOUNTER — Inpatient Hospital Stay: Attending: Hematology and Oncology | Admitting: Hematology and Oncology

## 2024-05-02 VITALS — BP 106/67 | HR 60 | Temp 98.6°F | Resp 18 | Ht 64.0 in | Wt 124.6 lb

## 2024-05-02 DIAGNOSIS — D72819 Decreased white blood cell count, unspecified: Secondary | ICD-10-CM | POA: Insufficient documentation

## 2024-05-02 DIAGNOSIS — D539 Nutritional anemia, unspecified: Secondary | ICD-10-CM | POA: Diagnosis not present

## 2024-05-02 LAB — CBC WITH DIFFERENTIAL (CANCER CENTER ONLY)
Abs Immature Granulocytes: 0 10*3/uL (ref 0.00–0.07)
Basophils Absolute: 0 10*3/uL (ref 0.0–0.1)
Basophils Relative: 0 %
Eosinophils Absolute: 0 10*3/uL (ref 0.0–0.5)
Eosinophils Relative: 1 %
HCT: 33.2 % — ABNORMAL LOW (ref 36.0–46.0)
Hemoglobin: 11.1 g/dL — ABNORMAL LOW (ref 12.0–15.0)
Immature Granulocytes: 0 %
Lymphocytes Relative: 45 %
Lymphs Abs: 1.2 10*3/uL (ref 0.7–4.0)
MCH: 31.2 pg (ref 26.0–34.0)
MCHC: 33.4 g/dL (ref 30.0–36.0)
MCV: 93.3 fL (ref 80.0–100.0)
Monocytes Absolute: 0.2 10*3/uL (ref 0.1–1.0)
Monocytes Relative: 7 %
Neutro Abs: 1.3 10*3/uL — ABNORMAL LOW (ref 1.7–7.7)
Neutrophils Relative %: 47 %
Platelet Count: 183 10*3/uL (ref 150–400)
RBC: 3.56 MIL/uL — ABNORMAL LOW (ref 3.87–5.11)
RDW: 12.3 % (ref 11.5–15.5)
WBC Count: 2.7 10*3/uL — ABNORMAL LOW (ref 4.0–10.5)
nRBC: 0 % (ref 0.0–0.2)

## 2024-05-02 LAB — IRON AND IRON BINDING CAPACITY (CC-WL,HP ONLY)
Iron: 98 ug/dL (ref 28–170)
Saturation Ratios: 29 % (ref 10.4–31.8)
TIBC: 343 ug/dL (ref 250–450)
UIBC: 245 ug/dL (ref 148–442)

## 2024-05-02 LAB — SEDIMENTATION RATE: Sed Rate: 20 mm/h (ref 0–22)

## 2024-05-02 LAB — FERRITIN: Ferritin: 50 ng/mL (ref 11–307)

## 2024-05-02 LAB — VITAMIN B12: Vitamin B-12: 1296 pg/mL — ABNORMAL HIGH (ref 180–914)

## 2024-05-02 NOTE — Progress Notes (Signed)
 [facility] CONSULT NOTE  Patient Care Team: Georgina Speaks, FNP as PCP - General (General Practice)  ASSESSMENT & PLAN:  Leukopenia She has chronic leukopenia I suspect this is constitutional She is not symptomatic I will order additional workup  Deficiency anemia She has been taking oral iron supplement for years She remains anemic I wonder if she might have thalassemia as she disclosed family history of anemia in her mother She stated her mother has been taking oral iron for a long time I will order additional workup and to rule out thalassemia Orders Placed This Encounter  Procedures   CBC with Differential (Cancer Center Only)    Standing Status:   Future    Number of Occurrences:   1    Expiration Date:   05/02/2025   Ferritin    Standing Status:   Future    Number of Occurrences:   1    Expiration Date:   05/02/2025   Vitamin B12    Standing Status:   Future    Number of Occurrences:   1    Expiration Date:   05/02/2025   Sedimentation rate    Standing Status:   Future    Number of Occurrences:   1    Expiration Date:   05/02/2025   Hgb Fractionation Cascade    Standing Status:   Future    Number of Occurrences:   1    Expected Date:   05/02/2024    Expiration Date:   05/02/2025   Alpha-Thalassemia Analysis    Standing Status:   Future    Number of Occurrences:   1    Expected Date:   05/02/2024    Expiration Date:   05/02/2025    Indication:   thalassemia eval   Iron and Iron Binding Capacity ([facility] only)    Standing Status:   Future    Number of Occurrences:   1    Expiration Date:   05/02/2025    All questions were answered. The patient knows to call the clinic with any problems, questions or concerns.  The total time spent in the appointment was 60 minutes encounter with patients including review of chart and various tests results, discussions about plan of care and coordination of care plan  Almarie Bedford, MD 6/23/20252:22 PM   CHIEF  COMPLAINTS/PURPOSE OF CONSULTATION:  Anemia  HISTORY OF PRESENTING ILLNESS:  Ann Boyle 48 y.o. female is here because of anemia and leukopenia  She was found to have abnormal CBC from routine blood work I have the opportunity to review his CBC dated back to 2019 On August 24, 2018, white blood cell count 3.7, hemoglobin 11.1 and platelet count 194 On February 09, 2024, white blood cell count 3.2, hemoglobin 11.4 and platelet count 182 She denies recent chest pain on exertion, shortness of breath on minimal exertion, pre-syncopal episodes, or palpitations. She had not noticed any recent bleeding such as epistaxis, hematuria or hematochezia The patient denies over the counter NSAID ingestion. She is not on antiplatelets agents. Her last colonoscopy was in 2023, normal She had no prior history or diagnosis of cancer. Her age appropriate screening programs are up-to-date. She denies any pica and eats a variety of diet. She never donated blood or received blood transfusion The patient was prescribed oral iron supplements and she takes daily at bedtime for years She had history of menorrhagia and had fibroid surgery in 2019 Her regular menstrual cycle is approximately 5 days every month  and she denies excessive bleeding From low white blood cell count perspective, no family history of personal history of autoimmune condition.  She denies recurrent infection.  Her last antibiotic was several years ago  MEDICAL HISTORY:  Past Medical History:  Diagnosis Date   Anemia    Complication of anesthesia    reaction to laughing gas   GERD (gastroesophageal reflux disease)    diet controlled - otc pepcid prn   Hyperlipidemia    diet controlled    SURGICAL HISTORY: Past Surgical History:  Procedure Laterality Date   DILATATION & CURRETTAGE/HYSTEROSCOPY WITH RESECTOCOPE N/A 08/24/2018   Procedure: DILATATION & CURETTAGE/HYSTEROSCOPY WITH RESECTOCOPE;  Surgeon: Darcel Pool, MD;  Location: [facility];  Service: Gynecology;  Laterality: N/A;   UPPER GI ENDOSCOPY     gerd   WISDOM TOOTH EXTRACTION     at age 43    SOCIAL HISTORY: Social History   Socioeconomic History   Marital status: Married    Spouse name: Not on file   Number of children: 5   Years of education: Not on file   Highest education level: Not on file  Occupational History   Not on file  Tobacco Use   Smoking status: Never   Smokeless tobacco: Never  Vaping Use   Vaping status: Never Used  Substance and Sexual Activity   Alcohol use: No   Drug use: No   Sexual activity: Yes    Birth control/protection: None  Other Topics Concern   Not on file  Social History Narrative   Not on file   Social Drivers of Health   Financial Resource Strain: Not on file  Food Insecurity: No Food Insecurity (05/02/2024)   Hunger Vital Sign    Worried About Running Out of Food in the Last Year: Never true    Ran Out of Food in the Last Year: Never true  Transportation Needs: No Transportation Needs (05/02/2024)   PRAPARE - Administrator, Civil Service (Medical): No    Lack of Transportation (Non-Medical): No  Physical Activity: Not on file  Stress: Not on file  Social Connections: Not on file  Intimate Partner Violence: Not At Risk (05/02/2024)   Humiliation, Afraid, Rape, and Kick questionnaire    Fear of Current or Ex-Partner: No    Emotionally Abused: No    Physically Abused: No    Sexually Abused: No    FAMILY HISTORY: Family History  Problem Relation Age of Onset   Hyperlipidemia Mother    Hyperlipidemia Sister     ALLERGIES:  has no known allergies.  MEDICATIONS:  Current Outpatient Medications  Medication Sig Dispense Refill   acetaminophen (TYLENOL) 500 MG tablet Take 1,000 mg by mouth 4 (four) times daily as needed for mild pain, fever or headache.      Carbonyl Iron (PERFECT IRON) 25 MG TABS Take 25 mg by mouth daily.     ezetimibe  (ZETIA ) 10 MG tablet Take 1 tablet (10 mg total) by  mouth daily. 30 tablet 2   Multiple Vitamins-Minerals (MULTIVITAMIN ADULT PO) Take 1 tablet by mouth daily.     No current facility-administered medications for this visit.    REVIEW OF SYSTEMS:   Constitutional: Denies fevers, chills or abnormal night sweats Eyes: Denies blurriness of vision, double vision or watery eyes Ears, nose, mouth, throat, and face: Denies mucositis or sore throat Respiratory: Denies cough, dyspnea or wheezes Cardiovascular: Denies palpitation, chest discomfort or lower extremity swelling Gastrointestinal:  Denies nausea, heartburn  or change in bowel habits Skin: Denies abnormal skin rashes Lymphatics: Denies new lymphadenopathy or easy bruising Neurological:Denies numbness, tingling or new weaknesses Behavioral/Psych: Mood is stable, no new changes  All other systems were reviewed with the patient and are negative.  PHYSICAL EXAMINATION: ECOG PERFORMANCE STATUS: 0 - Asymptomatic  Vitals:   05/02/24 1355  BP: 106/67  Pulse: 60  Resp: 18  Temp: 98.6 F (37 C)  SpO2: 100%   Filed Weights   05/02/24 1355  Weight: 124 lb 9.6 oz (56.5 kg)    GENERAL:alert, no distress and comfortable SKIN: skin color, texture, turgor are normal, no rashes or significant lesions EYES: normal, conjunctiva are pink and non-injected, sclera clear OROPHARYNX:no exudate, no erythema and lips, buccal mucosa, and tongue normal  NECK: supple, thyroid  normal size, non-tender, without nodularity LYMPH:  no palpable lymphadenopathy in the cervical, axillary or inguinal LUNGS: clear to auscultation and percussion with normal breathing effort HEART: regular rate & rhythm and no murmurs and no lower extremity edema ABDOMEN:abdomen soft, non-tender and normal bowel sounds Musculoskeletal:no cyanosis of digits and no clubbing  PSYCH: alert & oriented x 3 with fluent speech NEURO: no focal motor/sensory deficits

## 2024-05-02 NOTE — Assessment & Plan Note (Signed)
 She has chronic leukopenia I suspect this is constitutional She is not symptomatic I will order additional workup

## 2024-05-02 NOTE — Assessment & Plan Note (Signed)
 She has been taking oral iron supplement for years She remains anemic I wonder if she might have thalassemia as she disclosed family history of anemia in her mother She stated her mother has been taking oral iron for a long time I will order additional workup and to rule out thalassemia

## 2024-05-03 NOTE — Telephone Encounter (Signed)
 Returned patient's phone call. Verified name and DOB. Patient agreed to genetic testing. Informed patient if insurance does not cover, a $299 fee is applicable. Patient scheduled to be swabbed tomorrow 05/04/2024 at 1 pm at our [place] location. Patient verbalized understanding.  Josie RN

## 2024-05-04 ENCOUNTER — Ambulatory Visit (HOSPITAL_BASED_OUTPATIENT_CLINIC_OR_DEPARTMENT_OTHER): Admitting: Internal Medicine

## 2024-05-04 DIAGNOSIS — Z1379 Encounter for other screening for genetic and chromosomal anomalies: Secondary | ICD-10-CM | POA: Diagnosis not present

## 2024-05-04 LAB — HGB FRACTIONATION CASCADE
Hgb A2: 2.1 % (ref 1.8–3.2)
Hgb A: 97.9 % (ref 96.4–98.8)
Hgb F: 0 % (ref 0.0–2.0)
Hgb S: 0 %

## 2024-05-04 NOTE — Progress Notes (Signed)
   Nurse Visit   Date of Encounter: 05/04/2024 ID: Ann Boyle, DOB 1976-08-28, MRN [id number]  PCP:  Georgina Speaks, FNP   [facility] Providers Cardiologist:  None      Visit Details   VS:  There were no vitals taken for this visit. , BMI There is no height or weight on file to calculate BMI.  Wt Readings from Last 3 Encounters:  05/02/24 124 lb 9.6 oz (56.5 kg)  03/21/24 123 lb 9.6 oz (56.1 kg)  02/09/24 122 lb 6.4 oz (55.5 kg)     Reason for visit: Genetic testing Performed today: Education Changes (medications, testing, etc.) : no changes Length of Visit: 15 minutes    Medications Adjustments/Labs and Tests Ordered: No orders of the defined types were placed in this encounter.  No orders of the defined types were placed in this encounter.    Signed, Royal Horns, RN  05/04/2024 1:43 PM Genetic test for Familial Hyperlipidemia ordered ([facility]) Cheek swab completed in office Specimen and necessary paperwork mailed. ID: [id number]

## 2024-05-04 NOTE — Patient Instructions (Signed)
 Medication Instructions:  Your physician recommends that you continue on your current medications as directed. Please refer to the Current Medication list given to you today.  *If you need a refill on your cardiac medications before your next appointment, please call your pharmacy*  Testing/Procedures: Genetic test for Familial Dyslipidemia ordered ([facility]) Cheek swab completed in office Specimen and necessary paperwork mailed. ID: [id number]  Other Instructions Genetic testing was completed in office. Results take about 2-3 weeks to come back. Dr. Mona will reach out with results.

## 2024-05-09 LAB — ALPHA-THALASSEMIA ANALYSIS: IMAGE: 0

## 2024-05-10 NOTE — Telephone Encounter (Signed)
 I reviewed test results with the patient and addressed all her questions I recommend increasing oral iron supplement to twice daily Recent screening test for thalassemia was negative and her iron studies show borderline low iron with ferritin in the 50 and low iron saturation under 30% She will see her primary care doctor in October and I recommend repeat iron studies then including CBC We discussed risk and benefits of intravenous iron infusion

## 2024-05-26 ENCOUNTER — Other Ambulatory Visit: Payer: Self-pay | Admitting: Obstetrics and Gynecology

## 2024-05-26 DIAGNOSIS — R92343 Mammographic extreme density, bilateral breasts: Secondary | ICD-10-CM

## 2024-06-04 ENCOUNTER — Other Ambulatory Visit: Payer: Self-pay | Admitting: Nurse Practitioner

## 2024-06-04 DIAGNOSIS — E782 Mixed hyperlipidemia: Secondary | ICD-10-CM

## 2024-06-07 ENCOUNTER — Encounter (HOSPITAL_BASED_OUTPATIENT_CLINIC_OR_DEPARTMENT_OTHER): Payer: Self-pay | Admitting: Internal Medicine

## 2024-08-10 ENCOUNTER — Ambulatory Visit: Payer: Self-pay | Admitting: Nurse Practitioner

## 2024-08-10 ENCOUNTER — Encounter: Payer: Self-pay | Admitting: Nurse Practitioner

## 2024-08-10 VITALS — BP 110/70 | HR 81 | Temp 98.7°F | Ht 64.0 in | Wt 126.4 lb

## 2024-08-10 DIAGNOSIS — Z23 Encounter for immunization: Secondary | ICD-10-CM | POA: Insufficient documentation

## 2024-08-10 DIAGNOSIS — E7849 Other hyperlipidemia: Secondary | ICD-10-CM | POA: Diagnosis not present

## 2024-08-10 DIAGNOSIS — Z139 Encounter for screening, unspecified: Secondary | ICD-10-CM

## 2024-08-10 NOTE — Assessment & Plan Note (Addendum)
 She was diagnosed by Dr. Mona. Genetic predisposition with elevated lipoprotein(a) at 293.5, indicating high coronary heart disease risk. She was informed about Repatha's clean profile and lack of common statin side effects but expressed concerns about long-term use. - Continue Ezetimibe  10 mg oral daily. - Consider Metamucil for cholesterol management. - Discuss Repatha injections with specialist for lipoprotein(a) management. - Send lab results to specialist for evaluation and follow-up. - Encourage pharmacist consultation for Repatha pharmacokinetics. - Educated on high cholesterol risks and medication benefits. - I have advised her to reach out to Dr. Mona on best next steps, she said once the labs return she likely will. - I stressed the importance of treating this early.

## 2024-08-10 NOTE — Progress Notes (Signed)
 Ann Boyle, CMA,acting as a Neurosurgeon for Ann Ada, FNP.,have documented all relevant documentation on the behalf of Ann Ada, FNP,as directed by  Ann Ada, FNP while in the presence of Ann Ada, FNP.  Subjective:  Patient ID: Ann Boyle , female    DOB: 1976-05-12 , 48 y.o.   MRN: [id number]  Chief Complaint  Patient presents with   Hyperlipidemia    Patient presents today for a CHOL follow up, Patient reports compliance with medication. Patient denies any chest pain, SOB, or headaches. Patient has no concerns today.      HPI  Discussed the use of AI scribe software for clinical note transcription with the patient, who gave verbal consent to proceed.  History of Present Illness Ann Boyle is a 48 year old female with hyperlipidemia who presents for a cholesterol follow-up.  She has not started on Repatha injections due to concerns about the medication's novelty and potential side effects. She has researched the medication on reputable sites but has not discussed her concerns directly with her healthcare provider.  She has a genetic predisposition to high cholesterol, specifically elevated lipoprotein(a) levels, which are not effectively managed by statins. Her lipoprotein(a) level is 293.5, which is significantly higher than the threshold of 75. Despite a calcium  score of zero, her genetic predisposition remains a concern.  She is currently taking Zetia  and is considering adding Metamucil to her regimen to help manage her cholesterol levels. She has a family history of statin intolerance, as her mother experienced muscle cramps with statin use. She is cautious about starting statins due to potential side effects Boyle muscle cramps, which her husband also experiences.  She has not eaten today and acknowledges that her cholesterol levels might be elevated due to recent vacation dietary habits. She does not engage in regular exercise, which she recognizes could impact her  cholesterol levels. No shortness of breath or chest pain.  Past Medical History:  Diagnosis Date   Anemia    Complication of anesthesia    reaction to laughing gas   GERD (gastroesophageal reflux disease)    diet controlled - otc pepcid prn   Hyperlipidemia    diet controlled     Family History  Problem Relation Age of Onset   Hyperlipidemia Mother    Hyperlipidemia Sister      Current Outpatient Medications:    acetaminophen (TYLENOL) 500 MG tablet, Take 1,000 mg by mouth 4 (four) times daily as needed for mild pain, fever or headache. , Disp: , Rfl:    Carbonyl Iron (PERFECT IRON) 25 MG TABS, Take 25 mg by mouth daily., Disp: , Rfl:    ezetimibe  (ZETIA ) 10 MG tablet, Take 1 tablet by mouth once daily, Disp: 30 tablet, Rfl: 0   Multiple Vitamins-Minerals (MULTIVITAMIN ADULT PO), Take 1 tablet by mouth daily., Disp: , Rfl:    No Known Allergies   Review of Systems  Constitutional: Negative.  Negative for activity change and fatigue.  Respiratory: Negative.  Negative for choking, shortness of breath and wheezing.   Cardiovascular: Negative.  Negative for chest pain, palpitations and leg swelling.  Gastrointestinal: Negative.   Musculoskeletal: Negative.   Skin: Negative.   Neurological:  Negative for dizziness, weakness and headaches.  Psychiatric/Behavioral:  Negative for confusion. The patient is not nervous/anxious.      Today's Vitals   08/10/24 1613  BP: 110/70  Pulse: 81  Temp: 98.7 F (37.1 C)  TempSrc: Oral  Weight: 126 lb 6.4 oz (57.3 kg)  Height: 5' 4 (1.626 m)  PainSc: 0-No pain   Body mass index is 21.7 kg/m.  Wt Readings from Last 3 Encounters:  08/10/24 126 lb 6.4 oz (57.3 kg)  05/02/24 124 lb 9.6 oz (56.5 kg)  03/21/24 123 lb 9.6 oz (56.1 kg)     Objective:  Physical Exam Vitals and nursing note reviewed.  Constitutional:      General: She is not in acute distress.    Appearance: Normal appearance. She is well-developed and normal weight.   HENT:     Head: Normocephalic and atraumatic.  Cardiovascular:     Rate and Rhythm: Normal rate and regular rhythm.     Pulses: Normal pulses.     Heart sounds: Normal heart sounds. No murmur heard. Pulmonary:     Effort: Pulmonary effort is normal. No respiratory distress.     Breath sounds: Normal breath sounds. No wheezing.  Musculoskeletal:        General: Normal range of motion.  Skin:    General: Skin is warm and dry.     Capillary Refill: Capillary refill takes less than 2 seconds.  Neurological:     General: No focal deficit present.     Mental Status: She is alert and oriented to person, place, and time.     Cranial Nerves: No cranial nerve deficit.     Motor: No weakness.  Psychiatric:        Mood and Affect: Mood normal.        Behavior: Behavior normal.        Thought Content: Thought content normal.        Judgment: Judgment normal.      Assessment And Plan:  Familial hyperlipidemia Assessment & Plan: She was diagnosed by Dr. Mona. Genetic predisposition with elevated lipoprotein(a) at 293.5, indicating high coronary heart disease risk. She was informed about Repatha's clean profile and lack of common statin side effects but expressed concerns about long-term use. - Continue Ezetimibe  10 mg oral daily. - Consider Metamucil for cholesterol management. - Discuss Repatha injections with specialist for lipoprotein(a) management. - Send lab results to specialist for evaluation and follow-up. - Encourage pharmacist consultation for Repatha pharmacokinetics. - Educated on high cholesterol risks and medication benefits. - I have advised her to reach out to Dr. Mona on best next steps, she said once the labs return she likely will. - I stressed the importance of treating this early.   Orders: -     Lipid panel -     CMP14+EGFR  Need for influenza vaccination Assessment & Plan: Influenza vaccine administered Encouraged to take Tylenol as needed for fever or  muscle aches.   Orders: -     Flu vaccine trivalent PF, 6mos and older(Flulaval,Afluria,Fluarix,Fluzone)  Encounter for screening -     Hepatitis B surface antibody,qualitative    Return for KEEP  SAME NEXT.  Patient was given opportunity to ask questions. Patient verbalized understanding of the plan and was able to repeat key elements of the plan. All questions were answered to their satisfaction.   I personally spent 25 minutes face-to-face and non-face-to-face in the care of this patient, which includes all pre-, intra-, and post visit time on the date of service.  Ann Ann Ada, FNP, have reviewed all documentation for this visit. The documentation on 08/10/24 for the exam, diagnosis, procedures, and orders are all accurate and complete.   IF YOU HAVE BEEN REFERRED TO A SPECIALIST, IT MAY TAKE 1-2 WEEKS TO SCHEDULE/PROCESS THE  REFERRAL. IF YOU HAVE NOT HEARD FROM US /SPECIALIST IN TWO WEEKS, PLEASE GIVE US  A CALL AT [phone] X [phone].

## 2024-08-10 NOTE — Assessment & Plan Note (Signed)
 Influenza vaccine administered Encouraged to take Tylenol as needed for fever or muscle aches.

## 2024-08-11 ENCOUNTER — Telehealth: Payer: Self-pay

## 2024-08-11 DIAGNOSIS — F4323 Adjustment disorder with mixed anxiety and depressed mood: Secondary | ICD-10-CM | POA: Diagnosis not present

## 2024-08-11 LAB — CMP14+EGFR
ALT: 14 IU/L (ref 0–32)
AST: 20 IU/L (ref 0–40)
Albumin: 3.9 g/dL (ref 3.9–4.9)
Alkaline Phosphatase: 59 IU/L (ref 41–116)
BUN/Creatinine Ratio: 13 (ref 9–23)
BUN: 10 mg/dL (ref 6–24)
Bilirubin Total: 0.3 mg/dL (ref 0.0–1.2)
CO2: 22 mmol/L (ref 20–29)
Calcium: 8.9 mg/dL (ref 8.7–10.2)
Chloride: 102 mmol/L (ref 96–106)
Creatinine, Ser: 0.78 mg/dL (ref 0.57–1.00)
Globulin, Total: 3 g/dL (ref 1.5–4.5)
Glucose: 76 mg/dL (ref 70–99)
Potassium: 4.1 mmol/L (ref 3.5–5.2)
Sodium: 138 mmol/L (ref 134–144)
Total Protein: 6.9 g/dL (ref 6.0–8.5)
eGFR: 94 mL/min/1.73 (ref >59)

## 2024-08-11 LAB — LIPID PANEL
Chol/HDL Ratio: 2.5 ratio (ref 0.0–4.4)
Cholesterol, Total: 250 mg/dL — ABNORMAL HIGH (ref 100–199)
HDL: 100 mg/dL (ref >39)
LDL Chol Calc (NIH): 143 mg/dL — ABNORMAL HIGH (ref 0–99)
Triglycerides: 44 mg/dL (ref 0–149)
VLDL Cholesterol Cal: 7 mg/dL (ref 5–40)

## 2024-08-11 LAB — HEPATITIS B SURFACE ANTIBODY,QUALITATIVE: Hep B Surface Ab, Qual: NONREACTIVE

## 2024-08-11 NOTE — Telephone Encounter (Signed)
-----   Message from Almarie Bedford sent at 08/11/2024  9:09 AM EDT ----- I was under the impression  her PCP will repeat labs She ordered everything except CBC If she is not symptomatic no need to have labs done If she has concerns, schedule labs and see me in 2-3 weeks

## 2024-08-11 NOTE — Telephone Encounter (Signed)
 Called and given below message. She verbalized understanding. She denies symptoms and declined appt.  FYI

## 2024-08-14 ENCOUNTER — Ambulatory Visit: Payer: Self-pay | Admitting: Internal Medicine

## 2024-08-15 ENCOUNTER — Other Ambulatory Visit (HOSPITAL_COMMUNITY): Payer: Self-pay

## 2024-08-15 ENCOUNTER — Telehealth: Payer: Self-pay | Admitting: Pharmacy Technician

## 2024-08-15 NOTE — Telephone Encounter (Signed)
 Pharmacy Patient Advocate Encounter   Received notification from Physician's Office- andriette e that prior authorization for repatha is required/requested.   Insurance verification completed.   The patient is insured through [facility].   Per test claim: PA required; PA submitted to above mentioned insurance via Latent Key/confirmation #/EOC [id number] Status is pending

## 2024-08-16 ENCOUNTER — Other Ambulatory Visit (HOSPITAL_COMMUNITY): Payer: Self-pay

## 2024-08-16 NOTE — Telephone Encounter (Signed)
 Pharmacy Patient Advocate Encounter  Received notification from [facility] that Prior Authorization for Repatha has been APPROVED from 08/16/24 to 08/15/27. Ran test claim, Copay is $24.99- one month. This test claim was processed through [facility]- copay amounts may vary at other pharmacies due to pharmacy/plan contracts, or as the patient moves through the different stages of their insurance plan.   PA #/Case ID/Reference #: [id number]

## 2024-08-18 DIAGNOSIS — F4323 Adjustment disorder with mixed anxiety and depressed mood: Secondary | ICD-10-CM | POA: Diagnosis not present

## 2024-08-23 ENCOUNTER — Other Ambulatory Visit: Payer: Self-pay | Admitting: Nurse Practitioner

## 2024-08-23 DIAGNOSIS — E782 Mixed hyperlipidemia: Secondary | ICD-10-CM

## 2024-08-25 MED ORDER — REPATHA SURECLICK 140 MG/ML ~~LOC~~ SOAJ: 140.0000 mg | SUBCUTANEOUS | 3 refills | Status: AC

## 2024-09-01 DIAGNOSIS — F4323 Adjustment disorder with mixed anxiety and depressed mood: Secondary | ICD-10-CM | POA: Diagnosis not present

## 2024-09-08 DIAGNOSIS — F4323 Adjustment disorder with mixed anxiety and depressed mood: Secondary | ICD-10-CM | POA: Diagnosis not present

## 2024-09-19 ENCOUNTER — Other Ambulatory Visit: Payer: Self-pay | Admitting: Nurse Practitioner

## 2024-09-19 DIAGNOSIS — E782 Mixed hyperlipidemia: Secondary | ICD-10-CM

## 2024-09-29 DIAGNOSIS — F4323 Adjustment disorder with mixed anxiety and depressed mood: Secondary | ICD-10-CM | POA: Diagnosis not present

## 2024-10-03 DIAGNOSIS — F4323 Adjustment disorder with mixed anxiety and depressed mood: Secondary | ICD-10-CM | POA: Diagnosis not present

## 2024-10-13 DIAGNOSIS — F4323 Adjustment disorder with mixed anxiety and depressed mood: Secondary | ICD-10-CM | POA: Diagnosis not present

## 2024-10-25 ENCOUNTER — Other Ambulatory Visit

## 2024-11-11 ENCOUNTER — Other Ambulatory Visit: Payer: Self-pay | Admitting: Nurse Practitioner

## 2024-11-11 DIAGNOSIS — E782 Mixed hyperlipidemia: Secondary | ICD-10-CM

## 2024-12-13 ENCOUNTER — Other Ambulatory Visit: Payer: Self-pay | Admitting: Nurse Practitioner

## 2024-12-13 DIAGNOSIS — E782 Mixed hyperlipidemia: Secondary | ICD-10-CM

## 2025-02-14 ENCOUNTER — Encounter: Payer: Self-pay | Admitting: Nurse Practitioner
